# Patient Record
Sex: Female | Born: 1990 | Race: White | Hispanic: No | Marital: Married | Smoking: Never smoker
Health system: Southern US, Community
[De-identification: ages and names within clinical notes are randomized; demographics above are authoritative.]

## PROBLEM LIST (undated history)

## (undated) ENCOUNTER — Inpatient Hospital Stay: Payer: Self-pay

## (undated) DIAGNOSIS — L309 Dermatitis, unspecified: Secondary | ICD-10-CM

## (undated) DIAGNOSIS — N83209 Unspecified ovarian cyst, unspecified side: Secondary | ICD-10-CM

## (undated) DIAGNOSIS — Z973 Presence of spectacles and contact lenses: Secondary | ICD-10-CM

## (undated) DIAGNOSIS — G43909 Migraine, unspecified, not intractable, without status migrainosus: Secondary | ICD-10-CM

## (undated) HISTORY — DX: Unspecified ovarian cyst, unspecified side: N83.209

## (undated) HISTORY — DX: Migraine, unspecified, not intractable, without status migrainosus: G43.909

---
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
## 2010-03-08 ENCOUNTER — Emergency Department: Payer: Self-pay | Admitting: Emergency Medicine

## 2013-12-19 DIAGNOSIS — Z6791 Unspecified blood type, Rh negative: Secondary | ICD-10-CM | POA: Insufficient documentation

## 2013-12-19 DIAGNOSIS — O26899 Other specified pregnancy related conditions, unspecified trimester: Secondary | ICD-10-CM | POA: Insufficient documentation

## 2014-01-22 DIAGNOSIS — N83209 Unspecified ovarian cyst, unspecified side: Secondary | ICD-10-CM

## 2014-01-22 HISTORY — PX: DILATION AND CURETTAGE OF UTERUS: SHX78

## 2014-01-22 HISTORY — DX: Unspecified ovarian cyst, unspecified side: N83.209

## 2014-02-14 ENCOUNTER — Ambulatory Visit: Payer: Self-pay | Admitting: Obstetrics and Gynecology

## 2014-02-14 LAB — HEMOGLOBIN: HGB: 14.1 g/dL (ref 12.0–16.0)

## 2014-02-15 ENCOUNTER — Ambulatory Visit: Payer: Self-pay | Admitting: Obstetrics and Gynecology

## 2014-07-15 NOTE — Op Note (Signed)
PATIENT NAME:  Jamie [name], Jamie [name] MR#:  [id number] DATE OF BIRTH:  July 20, 1990  DATE OF PROCEDURE:  02/15/2014  PREOPERATIVE DIAGNOSIS: Missed abortion.   POSTOPERATIVE DIAGNOSIS: Missed abortion.   PROCEDURE: Suction dilation and curettage.   SURGEON: Laverta Baltimore, MD   ANESTHESIA: General endotracheal anesthesia.   INDICATION: A 24 year old gravida 1, para 0, patient at 49 weeks estimated gestational age. Ultrasound the day prior to the procedure showed crown-rump length consistent with 11 + 4 weeks and no fetal heart motion.   DESCRIPTION OF PROCEDURE: After adequate general endotracheal anesthesia, the patient was placed in the dorsal supine position. Legs were placed in the candycane stirrups. The patient was prepped and draped in the normal sterile fashion. A straight catheterization of the bladder yielded 150 mL of clear urine. A weighted speculum was placed in the posterior vaginal vault and the anterior cervix was grasped with a single-tooth tenaculum. Cervix was dilated to #18 Hanks dilator without difficulty, and a #8 flexible suction curette was placed into the endometrial cavity, a large amount of tissue was removed. The endometrial polyp forceps were used to tease out additional placental tissue and membranes. Sharp curettage was performed with good uterine cry on repeat. A repeat suction curettage performed with no additional tissue. Good hemostasis was noted. The patient did receive 0.2 mg of Methergine IM. The single-tooth tenaculum was removed from the anterior cervix, and silver nitrate was applied to the tenacula sites. The uterus palpated to 13 weeks prior to the procedure, and 8 weeks post procedure.   The patient tolerated the procedure well, and was taken to the recovery room in good condition.   The patient will receive a RhoGAM shot in the PACU given her Rh status is O negative.     ____________________________ Boykin Nearing, MD tjs:MT D: 02/15/2014  10:50:58 ET T: 02/15/2014 12:12:59 ET JOB#: [id number]  cc: Boykin Nearing, MD, <Dictator> Boykin Nearing MD ELECTRONICALLY SIGNED 02/18/2014 9:49

## 2014-07-17 LAB — SURGICAL PATHOLOGY

## 2014-09-29 ENCOUNTER — Encounter: Payer: Self-pay | Admitting: Internal Medicine

## 2014-12-08 ENCOUNTER — Encounter: Payer: Self-pay | Admitting: Obstetrics and Gynecology

## 2014-12-11 ENCOUNTER — Encounter: Payer: Self-pay | Admitting: Obstetrics and Gynecology

## 2014-12-15 ENCOUNTER — Other Ambulatory Visit: Payer: BLUE CROSS/BLUE SHIELD

## 2014-12-15 ENCOUNTER — Other Ambulatory Visit: Payer: Self-pay | Admitting: *Deleted

## 2014-12-15 ENCOUNTER — Encounter: Payer: Self-pay | Admitting: Obstetrics and Gynecology

## 2014-12-15 ENCOUNTER — Ambulatory Visit (INDEPENDENT_AMBULATORY_CARE_PROVIDER_SITE_OTHER): Payer: BLUE CROSS/BLUE SHIELD | Admitting: Obstetrics and Gynecology

## 2014-12-15 VITALS — BP 123/80 | HR 84 | Wt 121.9 lb

## 2014-12-15 DIAGNOSIS — N943 Premenstrual tension syndrome: Secondary | ICD-10-CM | POA: Diagnosis not present

## 2014-12-15 DIAGNOSIS — N921 Excessive and frequent menstruation with irregular cycle: Secondary | ICD-10-CM | POA: Diagnosis not present

## 2014-12-15 DIAGNOSIS — N839 Noninflammatory disorder of ovary, fallopian tube and broad ligament, unspecified: Secondary | ICD-10-CM

## 2014-12-15 DIAGNOSIS — G43839 Menstrual migraine, intractable, without status migrainosus: Secondary | ICD-10-CM | POA: Diagnosis not present

## 2014-12-15 DIAGNOSIS — R102 Pelvic and perineal pain: Secondary | ICD-10-CM

## 2014-12-15 DIAGNOSIS — N838 Other noninflammatory disorders of ovary, fallopian tube and broad ligament: Secondary | ICD-10-CM

## 2014-12-15 DIAGNOSIS — N920 Excessive and frequent menstruation with regular cycle: Secondary | ICD-10-CM

## 2014-12-15 DIAGNOSIS — N946 Dysmenorrhea, unspecified: Secondary | ICD-10-CM

## 2014-12-15 MED ORDER — NORGESTIMATE-ETH ESTRADIOL 0.25-35 MG-MCG PO TABS: 1.0000 | ORAL_TABLET | Freq: Every day | ORAL | Status: DC

## 2014-12-15 MED ORDER — IBUPROFEN 800 MG PO TABS: 800.0000 mg | ORAL_TABLET | Freq: Three times a day (TID) | ORAL | Status: DC | PRN

## 2014-12-15 NOTE — Progress Notes (Signed)
Subjective:     Patient ID: Maxie Better, female   DOB: 1991/03/10, 24 y.o.   MRN: [id number]  HPI Had D&C in Nov 2015 for 12 week missed abortion, was started on OCPs afterwards, and has had persistent BTB and heavy menses with severe cramping and pain with sex since; States they changed her pills a few times, and has been taking Loestrin since April with no improvement.  Review of Systems BTB Heavy menses, Pain with sex Back pain Severe cramping    Objective:   Physical Exam A&O x4 Well groomed female in no distress abdomen soft and non-tender Pelvic exam: normal external genitalia, vulva, vagina, cervix, uterus and adnexa, UTERUS: uterus is normal size, shape, consistency and nontender, mobile, shifted to the right , ADNEXA: fullness noted in left adnexa.   Indications:Left adnexal pain Findings:  The uterus measures 6.1 x 3.0 x 3.8 cm. Echo texture is homogenous without evidence of focal masses. The Endometrium measures 1.8 mm.  Right Ovary measures 2.3 x 1.2 x 1.4 cm. It is normal in appearance. Left Ovarian tissue is not visible. Survey of the adnexa demonstrates a large complex hypoechoic mass in the left adnexa with internal mobile debris and calcifications. This area measures 5.1 x 4.1 x 4.5 cm. There is no free fluid in the cul de sac.  Impression: 1. Large complex hypoechoic area left adnexa with internal debris and calcifications.  Assessment:     Left ovarian complex mass  Pelvic pain Menorrhagia on OCPs    Plan:     Change OCPs to higher dose and take continuously. Add Motrin 800mg  q8h prn- rx sent RTC 5-6 weeks for f/u u/s and see me

## 2014-12-15 NOTE — Patient Instructions (Signed)
Thank you for enrolling in [facility]. Please follow the instructions below to securely access your online medical record. MyChart allows you to send messages to your doctor, view your test results, renew your prescriptions, schedule appointments, and more.  How Do I Sign Up? 1. In your Internet browser, go to [email or web address]. 2. Click on the New  User? link in the Sign In box.  3. Enter your MyChart Access Code exactly as it appears below. You will not need to use this code after you have completed the sign-up process. If you do not sign up before the expiration date, you must request a new code. MyChart Access Code: [email or web address] Expires: 02/13/2015 12:27 PM  4. Enter the last four digits of your Social Security Number (xxxx) and Date of Birth (mm/dd/yyyy) as indicated and click Next. You will be taken to the next sign-up page. 5. Create a MyChart ID. This will be your MyChart login ID and cannot be changed, so think of one that is secure and easy to remember. 6. Create a MyChart password. You can change your password at any time. 7. Enter your Password Reset Question and Answer and click Next. This can be used at a later time if you forget your password.  8. Select your communication preference, and if applicable enter your e-mail address. You will receive e-mail notification when new information is available in MyChart by choosing to receive e-mail notifications and filling in your e-mail. 9. Click Sign In. You can now view your medical record.   Additional Information If you have questions, you can email [email or web address] or call [phone] to talk to our [facility] staff. Remember, MyChart is NOT to be used for urgent needs. For medical emergencies, dial 911. Ovarian Cyst An ovarian cyst is a fluid-filled sac that forms on an ovary. The ovaries are small organs that produce eggs in women. Various types of cysts can form on the ovaries. Most are not  cancerous. Many do not cause problems, and they often go away on their own. Some may cause symptoms and require treatment. Common types of ovarian cysts include:  Functional cysts--These cysts may occur every month during the menstrual cycle. This is normal. The cysts usually go away with the next menstrual cycle if the woman does not get pregnant. Usually, there are no symptoms with a functional cyst.  Endometrioma cysts--These cysts form from the tissue that lines the uterus. They are also called "chocolate cysts" because they become filled with blood that turns brown. This type of cyst can cause pain in the lower abdomen during intercourse and with your menstrual period.  Cystadenoma cysts--This type develops from the cells on the outside of the ovary. These cysts can get very big and cause lower abdomen pain and pain with intercourse. This type of cyst can twist on itself, cut off its blood supply, and cause severe pain. It can also easily rupture and cause a lot of pain.  Dermoid cysts--This type of cyst is sometimes found in both ovaries. These cysts may contain different kinds of body tissue, such as skin, teeth, hair, or cartilage. They usually do not cause symptoms unless they get very big.  Theca lutein cysts--These cysts occur when too much of a certain hormone (human chorionic gonadotropin) is produced and overstimulates the ovaries to produce an egg. This is most common after procedures used to assist with the conception of a baby (in vitro fertilization). CAUSES   Fertility drugs can cause a condition  in which multiple large cysts are formed on the ovaries. This is called ovarian hyperstimulation syndrome.  A condition called polycystic ovary syndrome can cause hormonal imbalances that can lead to nonfunctional ovarian cysts. SIGNS AND SYMPTOMS  Many ovarian cysts do not cause symptoms. If symptoms are present, they may include:  Pelvic pain or pressure.  Pain in the lower  abdomen.  Pain during sexual intercourse.  Increasing girth (swelling) of the abdomen.  Abnormal menstrual periods.  Increasing pain with menstrual periods.  Stopping having menstrual periods without being pregnant. DIAGNOSIS  These cysts are commonly found during a routine or annual pelvic exam. Tests may be ordered to find out more about the cyst. These tests may include:  Ultrasound.  X-ray of the pelvis.  CT scan.  MRI.  Blood tests. TREATMENT  Many ovarian cysts go away on their own without treatment. Your health care provider may want to check your cyst regularly for 2-3 months to see if it changes. For women in menopause, it is particularly important to monitor a cyst closely because of the higher rate of ovarian cancer in menopausal women. When treatment is needed, it may include any of the following:  A procedure to drain the cyst (aspiration). This may be done using a long needle and ultrasound. It can also be done through a laparoscopic procedure. This involves using a thin, lighted tube with a tiny camera on the end (laparoscope) inserted through a small incision.  Surgery to remove the whole cyst. This may be done using laparoscopic surgery or an open surgery involving a larger incision in the lower abdomen.  Hormone treatment or birth control pills. These methods are sometimes used to help dissolve a cyst. HOME CARE INSTRUCTIONS   Only take over-the-counter or prescription medicines as directed by your health care provider.  Follow up with your health care provider as directed.  Get regular pelvic exams and Pap tests. SEEK MEDICAL CARE IF:   Your periods are late, irregular, or painful, or they stop.  Your pelvic pain or abdominal pain does not go away.  Your abdomen becomes larger or swollen.  You have pressure on your bladder or trouble emptying your bladder completely.  You have pain during sexual intercourse.  You have feelings of fullness,  pressure, or discomfort in your stomach.  You lose weight for no apparent reason.  You feel generally ill.  You become constipated.  You lose your appetite.  You develop acne.  You have an increase in body and facial hair.  You are gaining weight, without changing your exercise and eating habits.  You think you are pregnant. SEEK IMMEDIATE MEDICAL CARE IF:   You have increasing abdominal pain.  You feel sick to your stomach (nauseous), and you throw up (vomit).  You develop a fever that comes on suddenly.  You have abdominal pain during a bowel movement.  Your menstrual periods become heavier than usual. MAKE SURE YOU:  Understand these instructions.  Will watch your condition.  Will get help right away if you are not doing well or get worse. Document Released: 03/10/2005 Document Revised: 03/15/2013 Document Reviewed: 11/15/2012 [facility] Patient Information 2015 [place], [place]. This information is not intended to replace advice given to you by your health care provider. Make sure you discuss any questions you have with your health care provider.

## 2014-12-16 LAB — COMPREHENSIVE METABOLIC PANEL
A/G RATIO: 1.8 (ref 1.1–2.5)
ALBUMIN: 4.2 g/dL (ref 3.5–5.5)
ALT: 12 IU/L (ref 0–32)
AST: 14 IU/L (ref 0–40)
Alkaline Phosphatase: 62 IU/L (ref 39–117)
BUN/Creatinine Ratio: 9 (ref 8–20)
BUN: 8 mg/dL (ref 6–20)
Bilirubin Total: 0.4 mg/dL (ref 0.0–1.2)
CO2: 20 mmol/L (ref 18–29)
Calcium: 9 mg/dL (ref 8.7–10.2)
Chloride: 103 mmol/L (ref 97–108)
Creatinine, Ser: 0.85 mg/dL (ref 0.57–1.00)
GFR, EST AFRICAN AMERICAN: 109 mL/min/{1.73_m2} (ref >59)
GFR, EST NON AFRICAN AMERICAN: 95 mL/min/{1.73_m2} (ref >59)
Globulin, Total: 2.4 g/dL (ref 1.5–4.5)
Glucose: 82 mg/dL (ref 65–99)
POTASSIUM: 4.2 mmol/L (ref 3.5–5.2)
Sodium: 140 mmol/L (ref 134–144)
TOTAL PROTEIN: 6.6 g/dL (ref 6.0–8.5)

## 2014-12-16 LAB — CBC
HEMATOCRIT: 46.3 % (ref 34.0–46.6)
HEMOGLOBIN: 15.4 g/dL (ref 11.1–15.9)
MCH: 29.4 pg (ref 26.6–33.0)
MCHC: 33.3 g/dL (ref 31.5–35.7)
MCV: 88 fL (ref 79–97)
Platelets: 423 10*3/uL — ABNORMAL HIGH (ref 150–379)
RBC: 5.24 x10E6/uL (ref 3.77–5.28)
RDW: 12.3 % (ref 12.3–15.4)
WBC: 10.4 10*3/uL (ref 3.4–10.8)

## 2014-12-16 LAB — THYROID PANEL WITH TSH
FREE THYROXINE INDEX: 2.7 (ref 1.2–4.9)
T3 Uptake Ratio: 22 % — ABNORMAL LOW (ref 24–39)
T4, Total: 12.4 ug/dL — ABNORMAL HIGH (ref 4.5–12.0)
TSH: 1.42 u[IU]/mL (ref 0.450–4.500)

## 2014-12-16 LAB — VITAMIN B12: Vitamin B-12: 262 pg/mL (ref 211–946)

## 2014-12-16 LAB — FERRITIN: Ferritin: 32 ng/mL (ref 15–150)

## 2014-12-19 ENCOUNTER — Telehealth: Payer: Self-pay | Admitting: Obstetrics and Gynecology

## 2014-12-19 NOTE — Telephone Encounter (Signed)
Yes, please let her know all labs looked fine

## 2014-12-19 NOTE — Telephone Encounter (Signed)
Results

## 2014-12-19 NOTE — Telephone Encounter (Signed)
PT CALLED AND WAS TOLD TO CALL IN TODAY PER [facility] FOR HER LAB RESULTS. SHE SAID IF YOU DID NOT GET HER ON HER CELL THAT YOU CAN CALL HER AT WORK.

## 2014-12-19 NOTE — Telephone Encounter (Signed)
Left detailed message for pt all labs normal

## 2014-12-22 ENCOUNTER — Ambulatory Visit: Payer: Self-pay | Admitting: Internal Medicine

## 2015-01-05 ENCOUNTER — Telehealth: Payer: Self-pay | Admitting: Obstetrics and Gynecology

## 2015-01-05 NOTE — Telephone Encounter (Signed)
Left message as asked and informed not to take placebo, and I encouraged pt to continue with Motrin as prescribed. To contact office if pain is unbearable, that pain is not uncommon with ovarian cyst.

## 2015-01-05 NOTE — Telephone Encounter (Signed)
Absolutely don't want her to take placebo, not only due to migraines, but also if she takes it continuously it will make the cyst go away faster.

## 2015-01-05 NOTE — Telephone Encounter (Signed)
She was put on new BC and normally she does not take the placebo because it causes her migraine, and she wanted to know if she can start a new pack today or if she needs to take the placebo, pt said out can call her back and leave a message if she does not answer, and she is in a lot of pain due to the cyst on her ovary, and she has been taking the 800 mg of ibrophen and it helps but she is still having pain and wanted to know if that is normal.

## 2015-01-19 ENCOUNTER — Encounter: Payer: Self-pay | Admitting: Obstetrics and Gynecology

## 2015-01-19 ENCOUNTER — Ambulatory Visit (INDEPENDENT_AMBULATORY_CARE_PROVIDER_SITE_OTHER): Payer: BLUE CROSS/BLUE SHIELD | Admitting: Obstetrics and Gynecology

## 2015-01-19 ENCOUNTER — Ambulatory Visit: Payer: BLUE CROSS/BLUE SHIELD

## 2015-01-19 VITALS — BP 121/72 | HR 90 | Wt 121.7 lb

## 2015-01-19 DIAGNOSIS — N838 Other noninflammatory disorders of ovary, fallopian tube and broad ligament: Secondary | ICD-10-CM | POA: Diagnosis not present

## 2015-01-19 DIAGNOSIS — N946 Dysmenorrhea, unspecified: Secondary | ICD-10-CM | POA: Diagnosis not present

## 2015-01-19 DIAGNOSIS — N943 Premenstrual tension syndrome: Secondary | ICD-10-CM | POA: Diagnosis not present

## 2015-01-19 DIAGNOSIS — N921 Excessive and frequent menstruation with irregular cycle: Secondary | ICD-10-CM | POA: Diagnosis not present

## 2015-01-19 DIAGNOSIS — G43839 Menstrual migraine, intractable, without status migrainosus: Secondary | ICD-10-CM | POA: Diagnosis not present

## 2015-01-19 DIAGNOSIS — R102 Pelvic and perineal pain: Secondary | ICD-10-CM | POA: Diagnosis not present

## 2015-01-19 DIAGNOSIS — N920 Excessive and frequent menstruation with regular cycle: Secondary | ICD-10-CM

## 2015-01-19 NOTE — Patient Instructions (Signed)
Diagnostic Laparoscopy A diagnostic laparoscopy is a procedure to diagnose diseases in the abdomen. During the procedure, a thin, lighted, pencil-sized instrument called a laparoscope is inserted into the abdomen through an incision. The laparoscope allows your health care provider to look at the organs inside your body. LET [facility] CARE PROVIDER KNOW ABOUT:  Any allergies you have.  All medicines you are taking, including vitamins, herbs, eye drops, creams, and over-the-counter medicines.  Previous problems you or members of your family have had with the use of anesthetics.  Any blood disorders you have.  Previous surgeries you have had.  Medical conditions you have. RISKS AND COMPLICATIONS  Generally, this is a safe procedure. However, problems can occur, which may include:  Infection.  Bleeding.  Damage to other organs.  Allergic reaction to the anesthetics used during the procedure. BEFORE THE PROCEDURE  Do not eat or drink anything after midnight on the night before the procedure or as directed by your health care provider.  Ask your health care provider about:  Changing or stopping your regular medicines.  Taking medicines such as aspirin and ibuprofen. These medicines can thin your blood. Do not take these medicines before your procedure if your health care provider instructs you not to.  Plan to have someone take you home after the procedure. PROCEDURE  You may be given a medicine to help you relax (sedative).  You will be given a medicine to make you sleep (general anesthetic).  Your abdomen will be inflated with a gas. This will make your organs easier to see.  Small incisions will be made in your abdomen.  A laparoscope and other small instruments will be inserted into the abdomen through the incisions.  A tissue sample may be removed from an organ in the abdomen for examination.  The instruments will be removed from the abdomen.  The gas will be  released.  The incisions will be closed with stitches (sutures). AFTER THE PROCEDURE  Your blood pressure, heart rate, breathing rate, and blood oxygen level will be monitored often until the medicines you were given have worn off.   This information is not intended to replace advice given to you by your health care provider. Make sure you discuss any questions you have with your health care provider.   Document Released: 06/16/2000 Document Revised: 11/29/2014 Document Reviewed: 10/21/2013 Elsevier Interactive Patient Education [employer].

## 2015-01-19 NOTE — Progress Notes (Signed)
Patient ID: Jamie [name], female   DOB: 1991-02-14, 24 y.o.   MRN: [id number]    Indications:  F/U left adnexal mass, menorrhagia and dysmenorrhea Findings:  The uterus measures 7.4 x 3.2 x 3.9 cm Echo texture is homogeous without evidence of focal masses. The Endometrium measures 3.1 mm.  Right Ovary measures 2.2 x 1.7 x 1.4 cm. It is normal in appearance. Left Ovary measures 2.8 x 1.3 x 1.2 cm. It is normal appearance. Survey of the adnexa demonstrates a left adnexal mass located between the uterus and left ovary that measures 4.9 x 4.6 x 5.0 cm which is essentially unchanged from prior ultrasound. Septations and a "ground glass" appearance is noted suggestive of a possible endometrioma. There is no free fluid in the cul de sac.  Impression: 1. Uterus and ovaries appear wnl. 2. Large left adnexal mass is again seen, appearing unchanged in size from prior ultrasound.   Recommendations: 1.Clinical correlation with the patient's History and Physical Exam.   Elliott,Teresa, Rad Tech  Scan reviewed and agree with findings. Reviewed with patient at today's visit.  Melody Trudee Kuster, CNM

## 2015-01-24 ENCOUNTER — Ambulatory Visit (INDEPENDENT_AMBULATORY_CARE_PROVIDER_SITE_OTHER): Payer: BLUE CROSS/BLUE SHIELD | Admitting: Obstetrics and Gynecology

## 2015-01-24 ENCOUNTER — Encounter: Payer: Self-pay | Admitting: Obstetrics and Gynecology

## 2015-01-24 VITALS — BP 113/72 | HR 73 | Ht 60.0 in | Wt 120.8 lb

## 2015-01-24 DIAGNOSIS — N83202 Unspecified ovarian cyst, left side: Secondary | ICD-10-CM | POA: Diagnosis not present

## 2015-01-24 DIAGNOSIS — N920 Excessive and frequent menstruation with regular cycle: Secondary | ICD-10-CM

## 2015-01-24 DIAGNOSIS — N946 Dysmenorrhea, unspecified: Secondary | ICD-10-CM | POA: Diagnosis not present

## 2015-01-24 DIAGNOSIS — F526 Dyspareunia not due to a substance or known physiological condition: Secondary | ICD-10-CM | POA: Diagnosis not present

## 2015-01-24 DIAGNOSIS — IMO0001 Reserved for inherently not codable concepts without codable children: Secondary | ICD-10-CM

## 2015-01-24 MED ORDER — HYDROCODONE-ACETAMINOPHEN 5-325 MG PO TABS: 1.0000 | ORAL_TABLET | Freq: Four times a day (QID) | ORAL | Status: DC | PRN

## 2015-01-24 NOTE — Patient Instructions (Signed)
Diagnostic Laparoscopy A diagnostic laparoscopy is a procedure to diagnose diseases in the abdomen. During the procedure, a thin, lighted, pencil-sized instrument called a laparoscope is inserted into the abdomen through an incision. The laparoscope allows your health care provider to look at the organs inside your body. LET [facility] CARE PROVIDER KNOW ABOUT:  Any allergies you have.  All medicines you are taking, including vitamins, herbs, eye drops, creams, and over-the-counter medicines.  Previous problems you or members of your family have had with the use of anesthetics.  Any blood disorders you have.  Previous surgeries you have had.  Medical conditions you have. RISKS AND COMPLICATIONS  Generally, this is a safe procedure. However, problems can occur, which may include:  Infection.  Bleeding.  Damage to other organs.  Allergic reaction to the anesthetics used during the procedure. BEFORE THE PROCEDURE  Do not eat or drink anything after midnight on the night before the procedure or as directed by your health care provider.  Ask your health care provider about:  Changing or stopping your regular medicines.  Taking medicines such as aspirin and ibuprofen. These medicines can thin your blood. Do not take these medicines before your procedure if your health care provider instructs you not to.  Plan to have someone take you home after the procedure. PROCEDURE  You may be given a medicine to help you relax (sedative).  You will be given a medicine to make you sleep (general anesthetic).  Your abdomen will be inflated with a gas. This will make your organs easier to see.  Small incisions will be made in your abdomen.  A laparoscope and other small instruments will be inserted into the abdomen through the incisions.  A tissue sample may be removed from an organ in the abdomen for examination.  The instruments will be removed from the abdomen.  The gas will be  released.  The incisions will be closed with stitches (sutures). AFTER THE PROCEDURE  Your blood pressure, heart rate, breathing rate, and blood oxygen level will be monitored often until the medicines you were given have worn off.   This information is not intended to replace advice given to you by your health care provider. Make sure you discuss any questions you have with your health care provider.   Document Released: 06/16/2000 Document Revised: 11/29/2014 Document Reviewed: 10/21/2013 Elsevier Interactive Patient Education [employer].

## 2015-01-25 ENCOUNTER — Telehealth: Payer: Self-pay | Admitting: Obstetrics and Gynecology

## 2015-01-25 NOTE — Telephone Encounter (Signed)
PT IS HAVING SURGERY ON Monday AND HER MANAGER WANTED HER TO GET A NOTE FROM DOCTOR CHERRY STATING THAT SHE IS HAVING A PROCEDURE AND THAT SHE  WILL BE OUT FROM HAVING SURGERY. PT STATED SHE WILL COME BY TOMORROW MORNING AND PICK IT UP.

## 2015-01-25 NOTE — H&P (Signed)
Preoperative History and Physical  Jamie [name] is a 24 y.o. G1P0010 here for surgical management of persistent left ovarian cyst (s/p failed medical management) and suspicion for endometriosis.   No significant preoperative concerns.  Proposed surgery: Laparoscopic left ovarian cystectomy, possible excision/fulguration of endometriosis  Past Medical History  Diagnosis Date  . Ovarian cyst   . Migraine    Past Surgical History  Procedure Laterality Date  . Dilation and curettage of uterus  01/2014   OB History  Gravida Para Term Preterm AB SAB TAB Ectopic Multiple Living  1    1  1        # Outcome Date GA Lbr Len/2nd Weight Sex Delivery Anes PTL Lv  1 TAB 01/2014            Patient denies any other pertinent gynecologic issues.    Family History  Problem Relation Age of Onset  . Hypertension Father     Social History  Substance Use Topics  . Smoking status: Never Smoker   . Smokeless tobacco: Never Used  . Alcohol Use: Yes     Comment: occas    Current Outpatient Prescriptions on File Prior to Visit  Medication Sig Dispense Refill  . butalbital-acetaminophen-caffeine (FIORICET WITH CODEINE) 50-325-40-30 MG per capsule Take 1 capsule by mouth every 4 (four) hours as needed for headache.    . ibuprofen (ADVIL,MOTRIN) 800 MG tablet Take 1 tablet (800 mg total) by mouth every 8 (eight) hours as needed. 60 tablet 1  . norgestimate-ethinyl estradiol (ORTHO-CYCLEN,SPRINTEC,PREVIFEM) 0.25-35 MG-MCG tablet Take 1 tablet by mouth daily. 3 Package 3   No current facility-administered medications on file prior to visit.    Allergies  Allergen Reactions  . Amoxicillin Hives  . Penicillins Hives     Review of Systems:  Constitutional: negative for chills, fatigue, fevers and sweats Eyes: negative for irritation, redness and visual disturbance Ears, nose, mouth, throat, and face: negative for hearing loss, nasal congestion, snoring and tinnitus Respiratory: negative for  asthma, cough, sputum Cardiovascular: negative for chest pain, dyspnea, exertional chest pressure/discomfort, irregular heart beat, palpitations and syncope Gastrointestinal: negative for abdominal pain, change in bowel habits, nausea and vomiting Genitourinary: positive mild deep dyspareunia, painful menses, pelvic pain; for negative for abnormal menstrual periods, genital lesions, and vaginal discharge, dysuria and urinary incontinence Integument/breast: negative for breast lump, breast tenderness and nipple discharge Hematologic/lymphatic: negative for bleeding and easy bruising Musculoskeletal:negative for back pain and muscle weakness Neurological: negative for dizziness, headaches, vertigo and weakness Endocrine: negative for diabetic symptoms including polydipsia, polyuria and skin dryness Allergic/Immunologic: negative for hay fever and urticaria     PHYSICAL EXAM: Blood pressure 113/72, pulse 73, height 5' (1.524 m), weight 120 lb 12.8 oz (54.795 kg). Body mass index is 23.59 kg/(m^2).  CONSTITUTIONAL: Well-developed, well-nourished female in no acute distress.  HENT:  Normocephalic, atraumatic, External right and left ear normal. Oropharynx is clear and moist EYES: Conjunctivae and EOM are normal. Pupils are equal, round, and reactive to light. No scleral icterus.  NECK: Normal range of motion, supple, no masses SKIN: Skin is warm and dry. No rash noted. Not diaphoretic. No erythema. No pallor. [place]: Alert and oriented to person, place, and time. Normal reflexes, muscle tone coordination. No cranial nerve deficit noted. PSYCHIATRIC: Normal mood and affect. Normal behavior. Normal judgment and thought content. CARDIOVASCULAR: Normal heart rate noted, regular rhythm RESPIRATORY: Effort and breath sounds normal, no problems with respiration noted ABDOMEN: Soft, mildly tender in LLQ, nondistended. PELVIC: Deferred  MUSCULOSKELETAL: Normal range of motion. No edema and no  tenderness. 2+ distal pulses.  Labs: No results found for this or any previous visit (from the past 336 hour(s)).  Imaging Studies: US Pelvis Complete  01/19/2015  ULTRASOUND REPORT Location: [facility] Date of Service: 01/19/15 Indications:  F/U left adnexal mass, menorrhagia and dysmenorrhea Findings: The uterus measures 7.4 x 3.2 x 3.9 cm Echo texture is homogeous without evidence of focal masses. The Endometrium measures 3.1 mm. Right Ovary measures 2.2 x 1.7 x 1.4 cm. It is normal in appearance. Left Ovary measures 2.8 x 1.3 x 1.2 cm. It is normal appearance. Survey of the adnexa demonstrates a left adnexal mass located between the uterus and left ovary that measures 4.9 x 4.6 x 5.0 cm which is essentially unchanged from prior ultrasound. Septations and a "ground glass" appearance is noted suggestive of a possible endometrioma. There is no free fluid in the cul de sac. Impression: 1. Uterus and ovaries appear wnl. 2. Large left adnexal mass is again seen, appearing unchanged in size from prior ultrasound. Recommendations: 1.Clinical correlation with the patient's History and Physical Exam. Elliott,Teresa, Rad Tech Scan reviewed and agree with findings. Reviewed with patient at today's visit. Melody Trudee Kuster, CNM    Assessment: 1. Persistent left adnexal cyst s/p failed medical management (suspicious for endometrioma) 2. Endometriosis (based on symptoms and ultrasound finding)  Plan: Patient will undergo surgical management with laparoscopic left ovarian cystectomy and possible excision/fulguration of endometriosis scheduled for 01/29/2015.   The risks of surgery were discussed in detail with the patient including but not limited to: bleeding which may require transfusion or reoperation; infection which may require antibiotics; injury to surrounding organs which may involve bowel, bladder, ureters ; need for additional procedures including laparoscopy or laparotomy; thromboembolic  phenomenon, surgical site problems and other postoperative/anesthesia complications. Likelihood of success in alleviating the patient's condition was discussed.Also discussed possibility of possible unilateral oophorectomy depending on the involvement of the cyst. Routine postoperative instructions will be reviewed with the patient and her family in detail after surgery.  The patient concurred with the proposed plan, giving informed written consent for the surgery.     Rubie Maid, MD [facility]

## 2015-01-25 NOTE — Telephone Encounter (Signed)
Letter printed and placed up front.

## 2015-01-25 NOTE — Progress Notes (Signed)
GYNECOLOGY PROGRESS NOTE  Subjective:    Patient ID: Jamie [name], female    DOB: Dec 22, 1990, 24 y.o.   MRN: [id number]  HPI  Patient is a 24 y.o. G83P0010 female who presents as a referral from Gennie Alma, CNM for pre-operative evaluation for persistent left ovarian cyst. Patient notes that she has had the cyst for 2 months, and has been tried on 2 separate OCPs with no resolution.    The following portions of the patient's history were reviewed and updated as appropriate: allergies, current medications, past family history, past medical history, past social history, past surgical history and problem list.  Review of Systems A comprehensive review of systems was negative except for: Genitourinary: positive for heavy menses, severe dysmenorrhea, and dyspareunia (since Nov 2015 after D&C for miscarriage)   Objective:   Blood pressure 113/72, pulse 73, height 5' (1.524 m), weight 120 lb 12.8 oz (54.795 kg). General appearance: alert and no distress Abdomen: normal findings: bowel sounds normal and soft and abnormal findings:  mild tenderness in the LLQ Pelvic: external genitalia normal, vagina normal without discharge. Cervix without lesions, mild CMT. Adnexal fullness on left, normal right adnexa. Uterus normal shape, size, mobile.  Extremities: extremities normal, atraumatic, no cyanosis or edema Neurologic: Grossly normal   Imaging 01/19/2015:  US Pelvis Complete  01/19/2015 ULTRASOUND REPORT Location: [facility] Date of Service: 01/19/15 Indications: F/U left adnexal mass, menorrhagia and dysmenorrhea Findings: The uterus measures 7.4 x 3.2 x 3.9 cm Echo texture is homogeous without evidence of focal masses. The Endometrium measures 3.1 mm. Right Ovary measures 2.2 x 1.7 x 1.4 cm. It is normal in appearance. Left Ovary measures 2.8 x 1.3 x 1.2 cm. It is normal appearance. Survey of the adnexa demonstrates a left adnexal mass located between the uterus and left ovary that  measures 4.9 x 4.6 x 5.0 cm which is essentially unchanged from prior ultrasound. Septations and a "ground glass" appearance is noted suggestive of a possible endometrioma. There is no free fluid in the cul de sac.   Impression:  1. Uterus and ovaries appear wnl.  2. Large left adnexal mass is again seen, appearing unchanged in size from prior ultrasound.   Recommendations: 1.Clinical correlation with the patient's History and Physical Exam.    Assessment:  1. Persistent left adnexal cyst s/p failed medical management (suspicious for endometrioma) 2. Endometriosis (based on symptoms and ultrasound finding)  Plan:  Patient desires surgical management with laparoscopic ovarian cystectomy.  Will also perform excision/fulguration of endometriosis if present. Surgery scheduled for 01/29/2015. The risks of surgery were discussed in detail with the patient including but not limited to: bleeding which may require transfusion or reoperation; infection which may require prolonged hospitalization or re-hospitalization and antibiotic therapy; injury to bowel, bladder, ureters and major vessels or other surrounding organs; need for additional procedures including laparotomy; thromboembolic phenomenon, incisional problems and other postoperative or anesthesia complications.  Patient was told that the likelihood that her condition and symptoms will be treated effectively with this surgical management was very high; the postoperative expectations were also discussed in detail. The patient also understands the alternative treatment options which were discussed in full. In addition, discussed possibility of unilateral oophorectomy if cyst cannot be dissectedAll questions were answered.  She was told that she will be contacted by our surgical scheduler regarding the time and date of her surgery; routine preoperative instructions of having nothing to eat or drink after midnight on the day prior to surgery and  also coming  to the hospital 1.5 hours prior to her time of surgery were also emphasized.  Printed patient education handouts about the procedure were given to the patient to review at home.   A total of 15 minutes were spent face-to-face with the patient during this encounter and over half of that time dealt with counseling and coordination of care.   Rubie Maid, MD [facility]

## 2015-01-26 ENCOUNTER — Encounter
Admission: RE | Admit: 2015-01-26 | Discharge: 2015-01-26 | Disposition: A | Discharge status: Home / Self Care | Payer: BLUE CROSS/BLUE SHIELD | Source: Ambulatory Visit | Attending: Obstetrics and Gynecology | Admitting: Obstetrics and Gynecology

## 2015-01-26 DIAGNOSIS — N946 Dysmenorrhea, unspecified: Secondary | ICD-10-CM | POA: Diagnosis present

## 2015-01-26 DIAGNOSIS — D3912 Neoplasm of uncertain behavior of left ovary: Secondary | ICD-10-CM | POA: Diagnosis not present

## 2015-01-26 DIAGNOSIS — N941 Unspecified dyspareunia: Secondary | ICD-10-CM | POA: Diagnosis not present

## 2015-01-26 LAB — CBC
HCT: 42.3 % (ref 35.0–47.0)
HEMOGLOBIN: 14 g/dL (ref 12.0–16.0)
MCH: 28.5 pg (ref 26.0–34.0)
MCHC: 33 g/dL (ref 32.0–36.0)
MCV: 86.2 fL (ref 80.0–100.0)
PLATELETS: 362 10*3/uL (ref 150–440)
RBC: 4.91 MIL/uL (ref 3.80–5.20)
RDW: 12.3 % (ref 11.5–14.5)
WBC: 12.4 10*3/uL — ABNORMAL HIGH (ref 3.6–11.0)

## 2015-01-26 LAB — TYPE AND SCREEN
ABO/RH(D): O NEG
Antibody Screen: NEGATIVE

## 2015-01-26 LAB — ABO/RH: ABO/RH(D): O NEG

## 2015-01-26 NOTE — Patient Instructions (Signed)
  Your procedure is scheduled on: Monday Nov. 7, 2016 at 12:15 PM . Report to Same Day Surgery.  Remember: Instructions that are not followed completely may result in serious medical risk, up to and including death, or upon the discretion of your surgeon and anesthesiologist your surgery may need to be rescheduled.    _x___ 1. Do not eat food or drink liquids after midnight. No gum chewing or hard candies.     _x___ 2. No Alcohol for 24 hours before or after surgery.   ____ 3. Bring all medications with you on the day of surgery if instructed.    __x__ 4. Notify your doctor if there is any change in your medical condition     (cold, fever, infections).     Do not wear jewelry, make-up, hairpins, clips or nail polish.  Do not wear lotions, powders, or perfumes. You may wear deodorant.  Do not shave 48 hours prior to surgery. Men may shave face and neck.  Do not bring valuables to the hospital.    [facility] is not responsible for any belongings or valuables.               Contacts, dentures or bridgework may not be worn into surgery.  Leave your suitcase in the car. After surgery it may be brought to your room.  For patients admitted to the hospital, discharge time is determined by your treatment team.   Patients discharged the day of surgery will not be allowed to drive home.    Please read over the following fact sheets that you were given:   [facility] Preparing for Surgery  ____ Take these medicines the morning of surgery with A SIP OF WATER: NONE    ____ Fleet Enema (as directed)   _X___ Use CHG Soap as directed  ____ Use inhalers on the day of surgery  ____ Stop metformin 2 days prior to surgery    ____ Take 1/2 of usual insulin dose the night before surgery and none on the morning of          surgery.   ____ Stop Coumadin/Plavix/aspirin does not apply.  _x___ Stop Anti-inflammatories SUCH AS IBUPROFEN NOW.  May take tylenol or Hydrocodone for pain.   ____ Stop  supplements until after surgery.    ____ Bring C-Pap to the hospital.

## 2015-01-29 ENCOUNTER — Ambulatory Visit
Admission: RE | Admit: 2015-01-29 | Discharge: 2015-01-29 | Disposition: A | Discharge status: Home / Self Care | Payer: BLUE CROSS/BLUE SHIELD | Source: Ambulatory Visit | Attending: Obstetrics and Gynecology | Admitting: Obstetrics and Gynecology

## 2015-01-29 ENCOUNTER — Encounter
Admission: RE | Disposition: A | Discharge status: Home / Self Care | Payer: Self-pay | Source: Ambulatory Visit | Attending: Obstetrics and Gynecology

## 2015-01-29 ENCOUNTER — Ambulatory Visit: Payer: BLUE CROSS/BLUE SHIELD | Admitting: Certified Registered Nurse Anesthetist

## 2015-01-29 ENCOUNTER — Encounter: Payer: Self-pay | Admitting: *Deleted

## 2015-01-29 DIAGNOSIS — N946 Dysmenorrhea, unspecified: Secondary | ICD-10-CM | POA: Insufficient documentation

## 2015-01-29 DIAGNOSIS — N941 Unspecified dyspareunia: Secondary | ICD-10-CM | POA: Diagnosis not present

## 2015-01-29 DIAGNOSIS — N920 Excessive and frequent menstruation with regular cycle: Secondary | ICD-10-CM | POA: Diagnosis not present

## 2015-01-29 DIAGNOSIS — D3912 Neoplasm of uncertain behavior of left ovary: Secondary | ICD-10-CM | POA: Diagnosis not present

## 2015-01-29 HISTORY — PX: LAPAROSCOPIC OVARIAN CYSTECTOMY: SHX6248

## 2015-01-29 LAB — POCT PREGNANCY, URINE: Preg Test, Ur: NEGATIVE

## 2015-01-29 SURGERY — EXCISION, CYST, OVARY, LAPAROSCOPIC
Anesthesia: General | Site: Abdomen | Laterality: Left | Wound class: Clean Contaminated

## 2015-01-29 MED ORDER — ONDANSETRON HCL 4 MG/2ML IJ SOLN: 4.0000 mg | Freq: Once | INTRAMUSCULAR | Status: DC | PRN

## 2015-01-29 MED ORDER — HYDROCODONE-ACETAMINOPHEN 5-325 MG PO TABS
ORAL_TABLET | ORAL | Status: AC
  Filled 2015-01-29: qty 1

## 2015-01-29 MED ORDER — BUPIVACAINE HCL 0.5 % IJ SOLN
INTRAMUSCULAR | Status: DC | PRN
  Administered 2015-01-29: 20 mL

## 2015-01-29 MED ORDER — ONDANSETRON HCL 4 MG/2ML IJ SOLN
INTRAMUSCULAR | Status: DC | PRN
  Administered 2015-01-29: 4 mg via INTRAVENOUS

## 2015-01-29 MED ORDER — BUPIVACAINE HCL (PF) 0.5 % IJ SOLN
INTRAMUSCULAR | Status: AC
  Filled 2015-01-29: qty 30

## 2015-01-29 MED ORDER — PROPOFOL 10 MG/ML IV BOLUS
INTRAVENOUS | Status: DC | PRN
  Administered 2015-01-29: 50 mg via INTRAVENOUS
  Administered 2015-01-29: 150 mg via INTRAVENOUS

## 2015-01-29 MED ORDER — LACTATED RINGERS IV SOLN: INTRAVENOUS | Status: DC

## 2015-01-29 MED ORDER — SIMETHICONE 80 MG PO CHEW: 80.0000 mg | CHEWABLE_TABLET | Freq: Four times a day (QID) | ORAL | Status: DC | PRN

## 2015-01-29 MED ORDER — ACETAMINOPHEN 10 MG/ML IV SOLN
INTRAVENOUS | Status: AC
  Filled 2015-01-29: qty 100

## 2015-01-29 MED ORDER — FENTANYL CITRATE (PF) 100 MCG/2ML IJ SOLN
INTRAMUSCULAR | Status: DC | PRN
  Administered 2015-01-29 (×4): 50 ug via INTRAVENOUS

## 2015-01-29 MED ORDER — FAMOTIDINE 20 MG PO TABS
ORAL_TABLET | ORAL | Status: AC
  Filled 2015-01-29: qty 1

## 2015-01-29 MED ORDER — DEXAMETHASONE SODIUM PHOSPHATE 4 MG/ML IJ SOLN
INTRAMUSCULAR | Status: DC | PRN
  Administered 2015-01-29: 10 mg via INTRAVENOUS

## 2015-01-29 MED ORDER — HYDROCODONE-ACETAMINOPHEN 5-325 MG PO TABS: 1.0000 | ORAL_TABLET | Freq: Once | ORAL | Status: DC

## 2015-01-29 MED ORDER — FAMOTIDINE 20 MG PO TABS
20.0000 mg | ORAL_TABLET | Freq: Once | ORAL | Status: AC
  Administered 2015-01-29: 20 mg via ORAL

## 2015-01-29 MED ORDER — LACTATED RINGERS IV SOLN
INTRAVENOUS | Status: DC
  Administered 2015-01-29: 13:00:00 via INTRAVENOUS

## 2015-01-29 MED ORDER — ONDANSETRON HCL 4 MG/2ML IJ SOLN
INTRAMUSCULAR | Status: AC
  Administered 2015-01-29: 4 mg
  Filled 2015-01-29: qty 2

## 2015-01-29 MED ORDER — HYDROMORPHONE HCL 1 MG/ML IJ SOLN
0.2500 mg | INTRAMUSCULAR | Status: DC | PRN
  Administered 2015-01-29 (×4): 0.5 mg via INTRAVENOUS

## 2015-01-29 MED ORDER — MIDAZOLAM HCL 2 MG/2ML IJ SOLN
INTRAMUSCULAR | Status: DC | PRN
  Administered 2015-01-29: 2 mg via INTRAVENOUS

## 2015-01-29 MED ORDER — CITRIC ACID-SODIUM CITRATE 334-500 MG/5ML PO SOLN: 30.0000 mL | ORAL | Status: DC

## 2015-01-29 MED ORDER — HYDROMORPHONE HCL 1 MG/ML IJ SOLN
INTRAMUSCULAR | Status: AC
  Filled 2015-01-29: qty 1

## 2015-01-29 MED ORDER — GLYCOPYRROLATE 0.2 MG/ML IJ SOLN
INTRAMUSCULAR | Status: DC | PRN
  Administered 2015-01-29: .4 mg via INTRAVENOUS

## 2015-01-29 MED ORDER — ROCURONIUM BROMIDE 100 MG/10ML IV SOLN
INTRAVENOUS | Status: DC | PRN
  Administered 2015-01-29: 30 mg via INTRAVENOUS

## 2015-01-29 MED ORDER — LIDOCAINE HCL (CARDIAC) 20 MG/ML IV SOLN
INTRAVENOUS | Status: DC | PRN
  Administered 2015-01-29: 60 mg via INTRAVENOUS

## 2015-01-29 MED ORDER — NEOSTIGMINE METHYLSULFATE 10 MG/10ML IV SOLN
INTRAVENOUS | Status: DC | PRN
  Administered 2015-01-29: 3 mg via INTRAVENOUS

## 2015-01-29 SURGICAL SUPPLY — 35 items
BLADE SURG SZ11 CARB STEEL (BLADE) ×2 IMPLANT
CANISTER SUCT 1200ML W/VALVE (MISCELLANEOUS) ×4 IMPLANT
CATH ROBINSON RED A/P 16FR (CATHETERS) ×2 IMPLANT
CHLORAPREP W/TINT 26ML (MISCELLANEOUS) ×2 IMPLANT
CORD MONOPOLAR M/FML 12FT (MISCELLANEOUS) ×2 IMPLANT
DRSG TEGADERM 2-3/8X2-3/4 SM (GAUZE/BANDAGES/DRESSINGS) ×8 IMPLANT
GLOVE BIO SURGEON STRL SZ 6 (GLOVE) ×12 IMPLANT
GLOVE BIOGEL PI IND STRL 6.5 (GLOVE) ×1 IMPLANT
GLOVE BIOGEL PI INDICATOR 6.5 (GLOVE) ×1
GOWN STRL REUS W/ TWL LRG LVL3 (GOWN DISPOSABLE) ×3 IMPLANT
GOWN STRL REUS W/TWL LRG LVL3 (GOWN DISPOSABLE) ×3
IRRIGATION STRYKERFLOW (MISCELLANEOUS) ×1 IMPLANT
IRRIGATOR STRYKERFLOW (MISCELLANEOUS) ×2
IV LACTATED RINGERS 1000ML (IV SOLUTION) ×4 IMPLANT
KIT RM TURNOVER CYSTO AR (KITS) ×2 IMPLANT
LABEL OR SOLS (LABEL) IMPLANT
LIQUID BAND (GAUZE/BANDAGES/DRESSINGS) ×2 IMPLANT
NS IRRIG 1000ML POUR BTL (IV SOLUTION) ×2 IMPLANT
NS IRRIG 500ML POUR BTL (IV SOLUTION) IMPLANT
PACK GYN LAPAROSCOPIC (MISCELLANEOUS) ×2 IMPLANT
PAD OB MATERNITY 4.3X12.25 (PERSONAL CARE ITEMS) ×2 IMPLANT
PAD PREP 24X41 OB/GYN DISP (PERSONAL CARE ITEMS) ×2 IMPLANT
POUCH ENDO CATCH 10MM SPEC (MISCELLANEOUS) ×2 IMPLANT
SCISSORS METZENBAUM CVD 33 (INSTRUMENTS) ×2 IMPLANT
SHEARS HARMONIC ACE PLUS 36CM (ENDOMECHANICALS) ×2 IMPLANT
SLEEVE ENDOPATH XCEL 5M (ENDOMECHANICALS) ×4 IMPLANT
STRIP CLOSURE SKIN 1/4X4 (GAUZE/BANDAGES/DRESSINGS) ×2 IMPLANT
SUT VIC AB 3-0 SH 27 (SUTURE) ×1
SUT VIC AB 3-0 SH 27X BRD (SUTURE) ×1 IMPLANT
SUT VICRYL 0 AB UR-6 (SUTURE) ×2 IMPLANT
SWABSTK COMLB BENZOIN TINCTURE (MISCELLANEOUS) ×2 IMPLANT
TROCAR ENDO BLADELESS 11MM (ENDOMECHANICALS) ×2 IMPLANT
TROCAR XCEL NON-BLD 5MMX100MML (ENDOMECHANICALS) ×2 IMPLANT
TROCAR XCEL UNIV SLVE 11M 100M (ENDOMECHANICALS) ×2 IMPLANT
TUBING INSUFFLATOR HI FLOW (MISCELLANEOUS) ×2 IMPLANT

## 2015-01-29 NOTE — Anesthesia Postprocedure Evaluation (Signed)
  Anesthesia Post-op Note  Patient: Jamie [name]  Procedure(s) Performed: Procedure(s): LAPAROSCOPIC OVARIAN CYSTECTOMY (Left)  Anesthesia type:General  Patient location: PACU  Post pain: Pain level controlled  Post assessment: Post-op Vital signs reviewed, Patient's Cardiovascular Status Stable, Respiratory Function Stable, Patent Airway and No signs of Nausea or vomiting  Post vital signs: Reviewed and stable  Last Vitals:  Filed Vitals:   01/29/15 1635  BP:   Pulse: 106  Temp:   Resp: 16    Level of consciousness: awake, alert  and patient cooperative  Complications: No apparent anesthesia complications

## 2015-01-29 NOTE — H&P (Signed)
UPDATE TO PREVIOUS HISTORY AND PHYSICAL  The patient has been seen and examined.  H&P is up to date, no changes noted.  Jamie [name] is a 23 y.o. G1P0010 here for surgical management of persistent left ovarian cyst (s/p failed medical management) and suspicion for endometriosis. No significant preoperative concerns.  Procedure, risks, benefits, alternatives have previously been explained.  Plan is for laparoscopic left ovarian cystectomy, however patient has also been counseled on possible oophorectomy. Patient can proceed to the OR for scheduled procedure.    Rubie Maid, MD 01/29/2015 2:07 PM

## 2015-01-29 NOTE — Discharge Instructions (Signed)
DO NOT REMOVE BANDAGES FOR 1 WEEK.    General Gynecological Post-Operative Instructions You may expect to feel dizzy, weak, and drowsy for as long as 24 hours after receiving the medicine that made you sleep (anesthetic).  Do not drive a car, ride a bicycle, participate in physical activities, or take public transportation until you are done taking narcotic pain medicines or as directed by your doctor.  Do not drink alcohol or take tranquilizers.  Do not take medicine that has not been prescribed by your doctor.  Do not sign important papers or make important decisions while on narcotic pain medicines.  Have a responsible person with you.  CARE OF INCISION  Keep incision clean and dry. Take showers instead of baths until your doctor gives you permission to take baths.  Avoid heavy lifting (more than 10 pounds/4.5 kilograms), pushing, or pulling.  Avoid activities that may risk injury to your surgical site.  No sexual intercourse or placement of anything in the vagina for 2-3 weeks or as instructed by your doctor. Only take prescription or over-the-counter medicines  for pain, discomfort, or fever as directed by your doctor. Do not take aspirin. It can make you bleed. Take medicines (antibiotics) that kill germs if they are prescribed for you.  Call the office or go to the Emergency Room if:  You feel sick to your stomach (nauseous).  You start to throw up (vomit).  You have trouble eating or drinking.  You have an oral temperature above 101.  You have constipation that is not helped by adjusting diet or increasing fluid intake. Pain medicines are a common cause of constipation.  You have any other concerns. SEEK IMMEDIATE MEDICAL CARE IF:  You have persistent dizziness.  You have difficulty breathing or a congested sounding (croupy) cough.  You have an oral temperature above 102.5, not controlled by medicine.  There is increasing pain or tenderness near or in the surgical site.    AMBULATORY SURGERY  DISCHARGE INSTRUCTIONS   1) The drugs that you were given will stay in your system until tomorrow so for the next 24 hours you should not:  A) Drive an automobile B) Make any legal decisions C) Drink any alcoholic beverage   2) You may resume regular meals tomorrow.  Today it is better to start with liquids and gradually work up to solid foods.  You may eat anything you prefer, but it is better to start with liquids, then soup and crackers, and gradually work up to solid foods.   3) Please notify your doctor immediately if you have any unusual bleeding, trouble breathing, redness and pain at the surgery site, drainage, fever, or pain not relieved by medication.    4) Additional Instructions:   AMBULATORY SURGERY  DISCHARGE INSTRUCTIONS   5) The drugs that you were given will stay in your system until tomorrow so for the next 24 hours you should not:  D) Drive an automobile E) Make any legal decisions F) Drink any alcoholic beverage   6) You may resume regular meals tomorrow.  Today it is better to start with liquids and gradually work up to solid foods.  You may eat anything you prefer, but it is better to start with liquids, then soup and crackers, and gradually work up to solid foods.   7) Please notify your doctor immediately if you have any unusual bleeding, trouble breathing, redness and pain at the surgery site, drainage, fever, or pain not relieved by medication.  8) Additional Instructions:        Please contact your physician with any problems or Same Day Surgery at [phone], Monday through Friday 6 am to 4 pm, or [place] at [facility] number at [phone].     Please contact your physician with any problems or Same Day Surgery at [phone], Monday through Friday 6 am to 4 pm, or [place] at [facility] number at [phone]\

## 2015-01-29 NOTE — Op Note (Addendum)
.Operative Laparoscopy Procedure Note  Indications: The patient is a 24 y.o. female with large left adnexal cyst (suspected endometrioma per ultrasound),s/p failed medical management with OCPs, symptoms suspicious of endometriosis.  Pre-operative Diagnosis: Left adnexal cyst, dysmenorrhea, dyspareunia, heavy menses  Post-operative Diagnosis: Same  Procedure: Laparoscopic left oophorectomy  Surgeon: Rubie Maid, MD  Assistants: Malachi Paradise, MD  Anesthesia: General endotracheal anesthesia  Findings: Exam Under Anesthesia - left adnexal fullness with palpable mass, uterus mobile and 6-8 week size, right adnexa normal. Laparoscopy -  The anterior cul-de-sac and round ligaments appeared normal The uterus appeared normal. The adnexa on left with large ~ 6 cm bilobed cyst on ovary, normal appearing fallopian tube.  The adnexa on the right appeared normal. Cul-de-sac without endometriosis or other lesions.  Abdomen noted to be free of adhesions.   ASA Class: 1  Procedure Details  The patient was seen in the Holding Room. The risks, benefits, complications, treatment options, and expected outcomes were discussed with the patient. The possibilities of reaction to medication, pulmonary aspiration, perforation of viscus, bleeding, recurrent infection, the need for additional procedures, failure to diagnose a condition, and creating a complication requiring transfusion or operation were discussed with the patient. The patient concurred with the proposed plan, giving informed consent. The patient was taken to the Operating Room, identified as Jamie [name] and the procedure verified as Laparoscopic Left Cystectomy (possible oophorectomy), excision/fulguration of endometriosis if present. A Time Out was held and the above information confirmed.  After induction of general anesthesia, the patient was placed in modified dorsal lithotomy position where she was prepped, draped, and catheterized  in the normal, sterile fashion.  A straight catheterization was performed.   A sterile speculum was placed inside the vagina. The cervix was visualized and an Hulka clamp was was placed for uterine manipulation. A 5 mm umbilical incision was then performed and an Optiview 5-mm trocar and sleeve were then advanced without difficulty with the laparoscope under direct visualization into the abdomen.  The abdomen was then insufflated with carbon dioxide gas.  Adequate pneumoperitoneum was obtained.  A survey of the patient's pelvis and abdomen revealed the findings above. A left and supraumbilical 5-mm lower quadrant port were then placed under direct visualization.  An 11-mm right lower quadrant port was placed under direct visualization.  Each incision was injected with 0.5% Sensorcaine, with a total of 17 ml used, prior to insertion of trocars.    Using a grasper, the mass was tented up and was noted to be bilobed with a more simple-appearing lobe and a complex lobe. The simple lobe of the cyst was incised, and there was noted to be mixture of mucinous and serious fluid.  The cyst wall was opened at there was noted to be strands of hair encased within the cyst.  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  The complex portion of the cyst appeared to significantly involve the ovarian tissue, and no clear identifiable plane could be determined to separate the cyst from the ovary.  The decision was then made to perform an oophorectomy.  The cyst was elevated in place to identify the utero-ovarian ligament, and the Harmonic scalpel was placed across this and several bites were taken with good visualization while ligating. The left ovary was then placed in an Endocatch bag and removed through the suprapubic incision. The skin was extended around this incision and the fascia was extended using the knife. The specimen was removed intact in  the Endocatch bag through this site.   The operative site was  surveyed, and it was found to be hemostatic.   No intraoperative injury to other surrounding organs was noted.  The abdomen was desufflated and all instruments were then removed from the patient's abdomen. The fascial incision of the right port site was closed with a 0 Vicryl figure in a running fashion. All skin incisions were closed with 3-0 Vicryl.  The Hulka clamp was removed from the cervix without complications. The patient tolerated the procedure well.  Sponge, lap, and needle counts were correct times two.  The patient was then taken to the recovery room awake, extubated and in stable condition.  The patient will be discharged to home as per PACU criteria.  Routine postoperative instructions given. She will follow up in the clinic in 2 weeks for postoperative evaluation.  Estimated Blood Loss:  Minimal         Drains: straight catheterization of bladder prior to start of procedure, 200 ml clear urine         Total IV Fluids: 750 mL         Specimens: Left ovary with cyst              Complications:  None; patient tolerated the procedure well.         Disposition: PACU - hemodynamically stable.         Condition: stable   SIGNED:  Rubie Maid, MD [facility]

## 2015-01-29 NOTE — Transfer of Care (Signed)
Immediate Anesthesia Transfer of Care Note  Patient: Jamie [name]  Procedure(s) Performed: Procedure(s): LAPAROSCOPIC OVARIAN CYSTECTOMY (Left)  Patient Location: PACU  Anesthesia Type:General  Level of Consciousness: awake, alert , oriented and patient cooperative  Airway & Oxygen Therapy: Patient Spontanous Breathing and Patient connected to face mask oxygen  Post-op Assessment: Report given to RN, Post -op Vital signs reviewed and stable and Patient moving all extremities X 4  Post vital signs: Reviewed and stable  Last Vitals:  Filed Vitals:   01/29/15 1219  BP: 127/73  Pulse: 92  Temp: 37.6 C  Resp: 16    Complications: No apparent anesthesia complications

## 2015-01-29 NOTE — Anesthesia Procedure Notes (Signed)
Procedure Name: Intubation Date/Time: 01/29/2015 2:59 PM Performed by: Silvana Newness Pre-anesthesia Checklist: Patient identified, Emergency Drugs available, Suction available, Patient being monitored and Timeout performed Patient Re-evaluated:Patient Re-evaluated prior to inductionOxygen Delivery Method: Circle system utilized Preoxygenation: Pre-oxygenation with 100% oxygen Intubation Type: IV induction Ventilation: Mask ventilation without difficulty Laryngoscope Size: Mac and 3 Grade View: Grade I Tube type: Oral Tube size: 7.0 mm Number of attempts: 1 Airway Equipment and Method: Rigid stylet Placement Confirmation: ETT inserted through vocal cords under direct vision,  positive ETCO2 and breath sounds checked- equal and bilateral Secured at: 19 cm Tube secured with: Tape Dental Injury: Teeth and Oropharynx as per pre-operative assessment

## 2015-01-29 NOTE — Anesthesia Preprocedure Evaluation (Signed)
Anesthesia Evaluation  Patient identified by MRN, date of birth, ID band Patient awake    Reviewed: Allergy & Precautions, NPO status , Patient's Chart, lab work & pertinent test results  Airway Mallampati: I  TM Distance: >3 FB Neck ROM: Full    Dental  (+) Teeth Intact   Pulmonary    Pulmonary exam normal        Cardiovascular Exercise Tolerance: Good negative cardio ROS Normal cardiovascular exam     Neuro/Psych    GI/Hepatic negative GI ROS,   Endo/Other  Elevated WBC at 12.4 today. Hb normal at 14.  Renal/GU      Musculoskeletal   Abdominal (+)  Abdomen: soft.    Peds  Hematology   Anesthesia Other Findings   Reproductive/Obstetrics                             Anesthesia Physical Anesthesia Plan  ASA: I  Anesthesia Plan: General   Post-op Pain Management:    Induction: Intravenous  Airway Management Planned: Oral ETT  Additional Equipment:   Intra-op Plan:   Post-operative Plan: Extubation in OR  Informed Consent: I have reviewed the patients History and Physical, chart, labs and discussed the procedure including the risks, benefits and alternatives for the proposed anesthesia with the patient or authorized representative who has indicated his/her understanding and acceptance.     Plan Discussed with: CRNA  Anesthesia Plan Comments:         Anesthesia Quick Evaluation

## 2015-01-30 ENCOUNTER — Encounter: Payer: Self-pay | Admitting: Obstetrics and Gynecology

## 2015-01-31 LAB — SURGICAL PATHOLOGY

## 2015-02-01 ENCOUNTER — Telehealth: Payer: Self-pay | Admitting: Obstetrics and Gynecology

## 2015-02-01 ENCOUNTER — Encounter: Payer: Self-pay | Admitting: Obstetrics and Gynecology

## 2015-02-01 NOTE — Telephone Encounter (Signed)
Patient called again requesting a note to be able to work. Thanks

## 2015-02-01 NOTE — Telephone Encounter (Signed)
Pt had surgery Monday but is feeling good and would like to go back to work tomorrow if possible, but by Monday anyway. She needs a note . Can you bring it to front desk?

## 2015-02-02 NOTE — Telephone Encounter (Signed)
Please let patient know that letter is ready to be picked up. Thanks

## 2015-02-14 ENCOUNTER — Encounter: Payer: Self-pay | Admitting: Obstetrics and Gynecology

## 2015-02-14 ENCOUNTER — Ambulatory Visit (INDEPENDENT_AMBULATORY_CARE_PROVIDER_SITE_OTHER): Payer: BLUE CROSS/BLUE SHIELD | Admitting: Obstetrics and Gynecology

## 2015-02-14 VITALS — BP 116/76 | HR 69 | Ht 60.0 in | Wt 121.0 lb

## 2015-02-14 DIAGNOSIS — Z9889 Other specified postprocedural states: Secondary | ICD-10-CM

## 2015-02-14 DIAGNOSIS — Z90721 Acquired absence of ovaries, unilateral: Secondary | ICD-10-CM

## 2015-02-14 NOTE — Progress Notes (Signed)
GYNECOLOGY POST-OPERATIVE CLINIC VISIT  Subjective:     Jamie [name] is a 24 y.o. female who presents to the clinic 2 weeks status post laparoscopic left oophorectomy for adnexal mass. Eating a regular diet without difficulty. Bowel movements are normal. The patient is not having any pain.  The following portions of the patient's history were reviewed and updated as appropriate: allergies, current medications, past family history, past medical history, past social history, past surgical history and problem list.  Review of Systems Pertinent items noted in HPI and remainder of comprehensive ROS otherwise negative.    Objective:    BP 116/76 mmHg  Pulse 69  Ht 5' (1.524 m)  Wt 121 lb (54.885 kg)  BMI 23.63 kg/m2  LMP 06/23/2014 General:  alert and no distress  Abdomen: soft, bowel sounds active, non-tender  Incision:   healing well, no drainage, no erythema, no hernia, no seroma, no swelling, no dehiscence, incision well approximated       Pathology 01/29/2015:  DIAGNOSIS:  A. OVARY, LEFT; OOPHORECTOMY:  - MATURE CYSTIC TERATOMA (DERMOID CYST).   Assessment:    Doing well postoperatively.    Plan:    1. Continue any current medications. 2. Wound care discussed. 3. Activity restrictions: none 4. Anticipated return to work: now. 5. Operative findings again reviewed. Pathology report discussed 6. Follow up: as needed.   Rubie Maid, MD [facility]

## 2015-04-10 ENCOUNTER — Telehealth: Payer: Self-pay | Admitting: Obstetrics and Gynecology

## 2015-04-10 NOTE — Telephone Encounter (Signed)
Please have pt come in for evaluation.

## 2015-04-10 NOTE — Telephone Encounter (Signed)
Update cell phone [phone] if calling after 4 pm

## 2015-04-10 NOTE — Telephone Encounter (Signed)
Having pain again on left side/ ovary and cyst removed on left side/ bleeding since 11/7, not every day, but spotting several days a week, having weird feelings in her stomach. Does pt need to make an appt. Or is this normal after that surgery 11/7

## 2015-04-17 ENCOUNTER — Ambulatory Visit (INDEPENDENT_AMBULATORY_CARE_PROVIDER_SITE_OTHER): Payer: Managed Care, Other (non HMO) | Admitting: Obstetrics and Gynecology

## 2015-04-17 ENCOUNTER — Encounter: Payer: Self-pay | Admitting: Obstetrics and Gynecology

## 2015-04-17 VITALS — BP 134/85 | HR 79 | Ht 60.0 in | Wt 120.5 lb

## 2015-04-17 DIAGNOSIS — R102 Pelvic and perineal pain: Secondary | ICD-10-CM

## 2015-04-17 DIAGNOSIS — N921 Excessive and frequent menstruation with irregular cycle: Secondary | ICD-10-CM

## 2015-04-18 NOTE — Progress Notes (Signed)
    GYNECOLOGY PROGRESS NOTE  Subjective:    Patient ID: Jamie [name], female    DOB: 06/26/1990, 25 y.o.   MRN: [id number]  HPI  Patient is a 25 y.o. G52P0010 female who presents for complaints of persistent vaginal bleeding and new onset bilateral mild intermittent pelvic pain. Patient is wondering if this is related to her surgery (had laparoscopic left oophorectomy for adnexal mass).  Is currently on OCPs, taking continuously.   The following portions of the patient's history were reviewed and updated as appropriate: allergies, current medications, past family history, past medical history, past social history, past surgical history and problem list.  Review of Systems Pertinent items noted in HPI and remainder of comprehensive ROS otherwise negative.   Objective:   Blood pressure 134/85, pulse 79, height 5' (1.524 m), weight 120 lb 8 oz (54.658 kg), last menstrual period 04/17/2015. General appearance: alert and no distress Abdomen: soft, non-tender; bowel sounds normal; no masses,  no organomegaly Pelvic: cervix normal in appearance, external genitalia normal, no adnexal masses or tenderness, no cervical motion tenderness, rectovaginal septum normal, uterus normal size, shape, and consistency and vagina normal without discharge Extremities: extremities normal, atraumatic, no cyanosis or edema Neurologic: Grossly normal   Assessment:   Breakthrough bleeding on OCPs  Pelvic pain  Plan:   Discussion had with patient that she has been on OCPs for several months without a break (since September).  Discussed that with most continuous cycle regimens, a 1 week hiatus is recommended after at least 3 months of consistent use.  Also was switched to a higher dose of estrogen as she was noting breakthrough bleeding with last OCP prior to current (was also taking continuously).  Advised on 1 week hiatus from OCPs, and then resume.  Otherwise, may need to change OCP.  Patient notes that she is  afraid to come off OCPs she gets debilitating headaches whenever she stops the pills, however headaches can be controlled with Fioricet. Patient states she will try to d/c pills for a few days and resume.  Does not desire to switch to a new pill as she also notes that she and partner are strongly considering attempts at conception in the next few months.   Discussed ordering pelvic ultrasound to assess for pain, however patient notes that pain is mild, and would like to hold off on ultrasound for now unless pain worsens. Can use OTC pain relievers as needed.  Advised to return if symptoms persist or worsen.     Rubie Maid, MD [facility]

## 2015-06-10 DIAGNOSIS — Z90721 Acquired absence of ovaries, unilateral: Secondary | ICD-10-CM | POA: Insufficient documentation

## 2016-03-18 ENCOUNTER — Other Ambulatory Visit: Payer: Self-pay | Admitting: Obstetrics and Gynecology

## 2016-03-18 DIAGNOSIS — M549 Dorsalgia, unspecified: Secondary | ICD-10-CM

## 2016-03-18 DIAGNOSIS — R319 Hematuria, unspecified: Secondary | ICD-10-CM

## 2016-03-21 ENCOUNTER — Ambulatory Visit
Admission: RE | Admit: 2016-03-21 | Discharge: 2016-03-21 | Disposition: A | Discharge status: Home / Self Care | Payer: Managed Care, Other (non HMO) | Source: Ambulatory Visit | Attending: Obstetrics and Gynecology | Admitting: Obstetrics and Gynecology

## 2016-03-21 DIAGNOSIS — R319 Hematuria, unspecified: Secondary | ICD-10-CM

## 2016-03-21 DIAGNOSIS — M549 Dorsalgia, unspecified: Secondary | ICD-10-CM | POA: Insufficient documentation

## 2016-04-17 ENCOUNTER — Ambulatory Visit: Payer: Managed Care, Other (non HMO) | Admitting: Urology

## 2016-05-01 ENCOUNTER — Other Ambulatory Visit: Payer: Self-pay | Admitting: *Deleted

## 2016-05-01 DIAGNOSIS — R3129 Other microscopic hematuria: Secondary | ICD-10-CM

## 2016-05-02 ENCOUNTER — Ambulatory Visit (INDEPENDENT_AMBULATORY_CARE_PROVIDER_SITE_OTHER): Payer: Managed Care, Other (non HMO) | Admitting: Urology

## 2016-05-02 ENCOUNTER — Other Ambulatory Visit
Admission: RE | Admit: 2016-05-02 | Discharge: 2016-05-02 | Disposition: A | Discharge status: Home / Self Care | Payer: Managed Care, Other (non HMO) | Source: Ambulatory Visit | Attending: Urology | Admitting: Urology

## 2016-05-02 ENCOUNTER — Encounter: Payer: Self-pay | Admitting: Urology

## 2016-05-02 VITALS — BP 111/62 | Ht 60.0 in | Wt 129.0 lb

## 2016-05-02 DIAGNOSIS — N9489 Other specified conditions associated with female genital organs and menstrual cycle: Secondary | ICD-10-CM | POA: Insufficient documentation

## 2016-05-02 DIAGNOSIS — R3129 Other microscopic hematuria: Secondary | ICD-10-CM | POA: Insufficient documentation

## 2016-05-02 DIAGNOSIS — R102 Pelvic and perineal pain: Secondary | ICD-10-CM | POA: Insufficient documentation

## 2016-05-02 LAB — PREGNANCY, URINE: Preg Test, Ur: NEGATIVE

## 2016-05-02 LAB — URINALYSIS, COMPLETE (UACMP) WITH MICROSCOPIC
BACTERIA UA: NONE SEEN
Bilirubin Urine: NEGATIVE
Glucose, UA: NEGATIVE mg/dL
Hgb urine dipstick: NEGATIVE
Ketones, ur: NEGATIVE mg/dL
Leukocytes, UA: NEGATIVE
Nitrite: NEGATIVE
PROTEIN: NEGATIVE mg/dL
Specific Gravity, Urine: 1.015 (ref 1.005–1.030)
WBC UA: NONE SEEN WBC/hpf (ref 0–5)
pH: 7 (ref 5.0–8.0)

## 2016-05-02 LAB — BUN: BUN: 7 mg/dL (ref 6–20)

## 2016-05-02 LAB — CREATININE, SERUM: CREATININE: 0.64 mg/dL (ref 0.44–1.00)

## 2016-05-02 NOTE — Progress Notes (Signed)
05/02/2016 11:22 AM   Jamie [name] 05-Sep-1990 [id number]  Referring provider: Darliss Cheney, CNM [place],  [id number]  Chief Complaint  Patient presents with  . Hematuria    New Patient    HPI: Patient is a 26 -year-old Caucasian female who presents today as a referral from their PCP, Darliss Cheney, CNM, for microscopic hematuria.    Patient was found to have microscopic hematuria on four occassions over the last two years.   Some of the AMH was associated with positive urine cultures.      She does not have a prior history of recurrent urinary tract infections, nephrolithiasis, trauma to the genitourinary tract or malignancies of the genitourinary tract.   She does not have a family medical history of nephrolithiasis, malignancies of the genitourinary tract or hematuria.   Today, she is  having symptoms of frequent urination, but she attributes this to drinking 70 to 80 oz of water daily. .  Her UA today demonstrates 0-5 RBC's.  She is not experiencing any suprapubic pain, abdominal pain or flank pain.  She denies any recent fevers, chills, nausea or vomiting.   She had a RUS in 2017 which was normal.  I have independently reviewed the films.  She is not a smoker.  She is not exposed to secondhand smoke.  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    PMH: Past Medical History:  Diagnosis Date  . Migraine   . Ovarian cyst   . Ovarian cyst 01/2014   left side    Surgical History: Past Surgical History:  Procedure Laterality Date  . DILATION AND CURETTAGE OF UTERUS  01/2014  . LAPAROSCOPIC OVARIAN CYSTECTOMY Left 01/29/2015   Procedure: LAPAROSCOPIC OVARIAN CYSTECTOMY;  Surgeon: Rubie Maid, MD;  Location: [facility] ORS;  Service: Gynecology;  Laterality: Left;  . WISDOM TOOTH EXTRACTION Bilateral 2009   all 4 teeth    Home Medications:  Allergies as of 05/02/2016      Reactions   Amoxicillin Hives   Penicillins Hives   Adhesive [tape] Rash   Round band aide Paper tape is OK. Round band aide Paper tape is OK.      Medication List       Accurate as of 05/02/16 11:22 AM. Always use your most recent med list.          butalbital-acetaminophen-caffeine 50-325-40-30 MG capsule Commonly known as:  FIORICET WITH CODEINE Take 1 capsule by mouth every 4 (four) hours as needed for headache.   multivitamin-prenatal 27-0.8 MG Tabs tablet Take 1 tablet by mouth daily at 12 noon.       Allergies:  Allergies  Allergen Reactions  . Amoxicillin Hives  . Penicillins Hives  . Adhesive [Tape] Rash    Round band aide Paper tape is OK. Round band aide Paper tape is OK.    Family History: Family History  Problem Relation Age of Onset  . Hypertension Father     Social History:  reports that she has never smoked. She has never used smokeless tobacco. She reports that she drinks alcohol. She reports that she does not use drugs.  ROS: UROLOGY Frequent Urination?: Yes Hard to postpone urination?: No Burning/pain with urination?: No Get up at night to urinate?: No Leakage of urine?: No Urine stream starts and stops?: No Trouble starting stream?: No Do you have to strain to urinate?: No Blood in urine?: Yes Urinary tract infection?: No Sexually transmitted disease?: No Injury  to kidneys or bladder?: No Painful intercourse?: Yes Weak stream?: No Currently pregnant?: No Vaginal bleeding?: No Last menstrual period?: 03/20/16  Gastrointestinal Nausea?: No Vomiting?: No Indigestion/heartburn?: No Diarrhea?: No Constipation?: No  Constitutional Fever: No Night sweats?: No Weight loss?: No Fatigue?: No  Skin Skin rash/lesions?: No Itching?: No  Eyes Blurred vision?: No Double vision?: No  Ears/Nose/Throat Sore throat?: No Sinus problems?: No  Hematologic/Lymphatic Swollen glands?: No Easy bruising?: No  Cardiovascular Leg swelling?: No Chest pain?: No  Respiratory Cough?:  No Shortness of breath?: No  Endocrine Excessive thirst?: Yes  Musculoskeletal Back pain?: Yes Joint pain?: No  Neurological Headaches?: Yes Dizziness?: No  Psychologic Depression?: No Anxiety?: No  Physical Exam: BP 111/62   Ht 5' (1.524 m)   Wt 129 lb (58.5 kg)   LMP 03/20/2016 (Approximate)   BMI 25.19 kg/m   Constitutional: Well nourished. Alert and oriented, No acute distress. HEENT: [place] AT, moist mucus membranes. Trachea midline, no masses. Cardiovascular: No clubbing, cyanosis, or edema. Respiratory: Normal respiratory effort, no increased work of breathing. GI: Abdomen is soft, non tender, non distended, no abdominal masses. Liver and spleen not palpable.  No hernias appreciated.  Stool sample for occult testing is not indicated.   GU: No CVA tenderness.  No bladder fullness or masses.   Skin: No rashes, bruises or suspicious lesions. Lymph: No cervical or inguinal adenopathy. Neurologic: Grossly intact, no focal deficits, moving all 4 extremities. Psychiatric: Normal mood and affect.  Laboratory Data: Lab Results  Component Value Date   WBC 12.4 (H) 01/26/2015   HGB 14.0 01/26/2015   HCT 42.3 01/26/2015   MCV 86.2 01/26/2015   PLT 362 01/26/2015    Lab Results  Component Value Date   CREATININE 0.64 05/02/2016     Lab Results  Component Value Date   TSH 1.420 12/15/2014    Lab Results  Component Value Date   AST 14 12/15/2014   Lab Results  Component Value Date   ALT 12 12/15/2014     Urinalysis 0-5 RBC's.  See EPIC.   Pertinent Imaging: CLINICAL DATA:  Microhematuria.  Right flank pain on off for 1 week.  EXAM: RENAL / URINARY TRACT ULTRASOUND COMPLETE  COMPARISON:  None.  FINDINGS: Right Kidney:  Length: 11.4 cm. Echogenicity within normal limits. No mass or hydronephrosis visualized.  Left Kidney:  Length: 10.9 cm. Echogenicity within normal limits. No mass or hydronephrosis visualized.  Bladder:  Normal.   Normal bilateral ureteral jets visualized.  IMPRESSION: Normal renal ultrasound.   Electronically Signed   By: Lajean Manes M.D.   On: 03/21/2016 14:41  Assessment & Plan:    1. Microscopic hematuria   - I explained to the patient that there are a number of causes that can be associated with blood in the urine, such as stones, UTI's, damage to the urinary tract and/or cancer.  - At this time, I felt that the patient warranted further urologic evaluation.   The AUA guidelines state that a CT urogram is the preferred imaging study to evaluate hematuria.  - I explained to the patient that a contrast material will be injected into a vein and that in rare instances, an allergic reaction can result and may even life threatening   The patient denies any allergies to contrast, iodine and/or seafood and is not taking metformin.  - Her reproductive status is unknown at this time.  We will obtain a serum pregnancy test today.   - UA  - urine  culture  - BUN + Creatinine  - serum pregnancy test  Return for I will call patient with results.  These notes generated with voice recognition software. I apologize for typographical errors.  Zara Council, [facility] [address] [place], [place] [address] [phone]

## 2016-05-03 LAB — URINE CULTURE: Culture: <10000 — AB

## 2016-05-16 ENCOUNTER — Other Ambulatory Visit
Admission: RE | Admit: 2016-05-16 | Discharge: 2016-05-16 | Disposition: A | Discharge status: Home / Self Care | Payer: Managed Care, Other (non HMO) | Source: Ambulatory Visit | Attending: Urology | Admitting: Urology

## 2016-05-16 ENCOUNTER — Other Ambulatory Visit: Payer: Self-pay

## 2016-05-16 ENCOUNTER — Ambulatory Visit
Admission: RE | Admit: 2016-05-16 | Discharge: 2016-05-16 | Disposition: A | Discharge status: Home / Self Care | Payer: Managed Care, Other (non HMO) | Source: Ambulatory Visit | Attending: Urology | Admitting: Urology

## 2016-05-16 DIAGNOSIS — N200 Calculus of kidney: Secondary | ICD-10-CM | POA: Insufficient documentation

## 2016-05-16 DIAGNOSIS — R3129 Other microscopic hematuria: Secondary | ICD-10-CM

## 2016-05-16 LAB — HCG, QUANTITATIVE, PREGNANCY

## 2016-05-16 MED ORDER — IOPAMIDOL (ISOVUE-300) INJECTION 61%
125.0000 mL | Freq: Once | INTRAVENOUS | Status: AC | PRN
  Administered 2016-05-16: 125 mL via INTRAVENOUS

## 2016-05-19 ENCOUNTER — Telehealth: Payer: Self-pay

## 2016-05-19 ENCOUNTER — Telehealth: Payer: Self-pay | Admitting: Urology

## 2016-05-19 NOTE — Telephone Encounter (Signed)
Spoke with pt in reference to CT results and needing a cysto to evaluate for hematuria. Pt voiced understanding. Pt was transferred to the front to make f/u appt.

## 2016-05-19 NOTE — Telephone Encounter (Signed)
Enter in error

## 2016-05-19 NOTE — Telephone Encounter (Signed)
LMOM

## 2016-05-19 NOTE — Telephone Encounter (Signed)
Pt had CT done last Friday and doesn't have a follow up.  She is very anxious and wants to know if you will call her with CT results.  I told her it may not be til the end of the day.

## 2016-05-19 NOTE — Telephone Encounter (Signed)
-----   Message from Nori Riis, PA-C sent at 05/17/2016  6:40 PM EST ----- Please notify the patient that her CT scan has found a 2 mm kidney stone.  These stone should no be causing flank pain and may or may not be causing the blood in the urine.  She may choose to under a cystoscopy at this time to evaluate the inside of her bladder to see if there is an area in the bladder that is causing her to have blood in her urine.

## 2016-05-20 NOTE — Telephone Encounter (Signed)
-----   Message from Nori Riis, PA-C sent at 05/17/2016  6:40 PM EST ----- Please notify the patient that her CT scan has found a 2 mm kidney stone.  These stone should no be causing flank pain and may or may not be causing the blood in the urine.  She may choose to under a cystoscopy at this time to evaluate the inside of her bladder to see if there is an area in the bladder that is causing her to have blood in her urine.

## 2016-05-20 NOTE — Telephone Encounter (Signed)
Patient called back to say she already had this message. Patient ok with the plan.

## 2016-05-20 NOTE — Telephone Encounter (Signed)
LMOM for patient to call office back. 

## 2016-06-03 ENCOUNTER — Ambulatory Visit: Payer: Managed Care, Other (non HMO) | Admitting: Urology

## 2016-06-03 ENCOUNTER — Encounter: Payer: Self-pay | Admitting: Urology

## 2016-06-03 VITALS — BP 111/77 | HR 87 | Ht 60.0 in | Wt 127.9 lb

## 2016-06-03 DIAGNOSIS — R3129 Other microscopic hematuria: Secondary | ICD-10-CM | POA: Diagnosis not present

## 2016-06-03 LAB — URINALYSIS, COMPLETE
Bilirubin, UA: NEGATIVE
Glucose, UA: NEGATIVE
KETONES UA: NEGATIVE
Leukocytes, UA: NEGATIVE
Nitrite, UA: NEGATIVE
PROTEIN UA: NEGATIVE
RBC, UA: NEGATIVE
SPEC GRAV UA: 1.01 (ref 1.005–1.030)
Urobilinogen, Ur: 0.2 mg/dL (ref 0.2–1.0)
pH, UA: 6.5 (ref 5.0–7.5)

## 2016-06-03 LAB — MICROSCOPIC EXAMINATION: RBC, UA: NONE SEEN /hpf (ref 0–?)

## 2016-06-03 MED ORDER — LIDOCAINE HCL 2 % EX GEL: 1.0000 "application " | Freq: Once | CUTANEOUS | Status: DC

## 2016-06-03 MED ORDER — CIPROFLOXACIN HCL 500 MG PO TABS: 500.0000 mg | ORAL_TABLET | Freq: Once | ORAL | Status: DC

## 2016-06-03 NOTE — Progress Notes (Signed)
This is a 26 year old female who is seen today for scheduled cystoscopy. She was seen and evaluated for microscopic hematuria by our PA. She has undergone a CT urogram prior to her visit today. This demonstrated a very small nonobstructing right kidney stone, but otherwise no abnormalities.  The patient is otherwise quite healthy and has not had any visible blood. She denies any significant urinary tract symptoms including frequency, urgency, incontinence or dysuria. She does not have a history of recurrent infections. She has had a T and C procedure as well as an oopherectomy.  Current Outpatient Prescriptions on File Prior to Visit  Medication Sig Dispense Refill  . butalbital-acetaminophen-caffeine (FIORICET WITH CODEINE) 50-325-40-30 MG per capsule Take 1 capsule by mouth every 4 (four) hours as needed for headache.    . Prenatal Vit-Fe Fumarate-FA (MULTIVITAMIN-PRENATAL) 27-0.8 MG TABS tablet Take 1 tablet by mouth daily at 12 noon.     No current facility-administered medications on file prior to visit.      Past Medical History:  Diagnosis Date  . Migraine   . Ovarian cyst   . Ovarian cyst 01/2014   left side   Past Surgical History:  Procedure Laterality Date  . DILATION AND CURETTAGE OF UTERUS  01/2014  . LAPAROSCOPIC OVARIAN CYSTECTOMY Left 01/29/2015   Procedure: LAPAROSCOPIC OVARIAN CYSTECTOMY;  Surgeon: Rubie Maid, MD;  Location: [facility] ORS;  Service: Gynecology;  Laterality: Left;  . WISDOM TOOTH EXTRACTION Bilateral 2009   all 4 teeth   Vitals:   06/03/16 0942  BP: 111/77  Pulse: 87   NAD  UA - clear, no RBC.    CT scan: I have independently reviewed the patient's CT scan, she has a nonobstructing right kidney stone which is very small. There are no other significant abnormalities.  Impression/Plan: The patient was scheduled to undergo cystoscopy to complete the hematuria evaluation. We discussed this procedure in significant detail. Given her normal CT scan and  otherwise young and healthy status, I think the probability of finding something on her cystoscopy is low. However, without checking, we can never be 100%. I discussed this with the patient, I told her that she is likely to have hematuria on subsequent UAs, and may have to address this issue in the future, but currently/today the patient is willing to accept the risk (albeit low) that there is something pathologic in her bladder. She will follow-up with [place] on a when necessary basis.

## 2016-12-15 DIAGNOSIS — O0993 Supervision of high risk pregnancy, unspecified, third trimester: Secondary | ICD-10-CM | POA: Insufficient documentation

## 2017-02-26 IMAGING — US US RENAL
1 series · 14 of 25 positions shown · non-contrast
Comparison: None.

CLINICAL DATA: Microhematuria.  Right flank pain on off for 1 week.

EXAM:
RENAL / URINARY TRACT ULTRASOUND COMPLETE

[Series 1: us renal · 0.22mm/px · 14 of 33 slices shown]
[im 1/33]
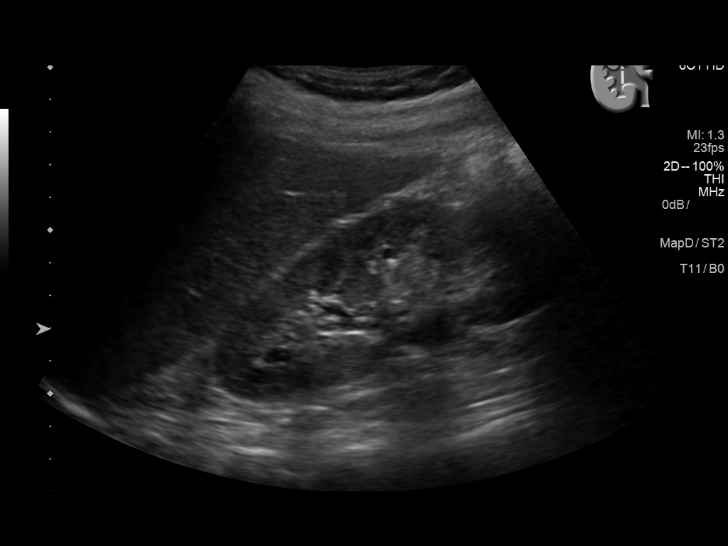
[im 3/33]
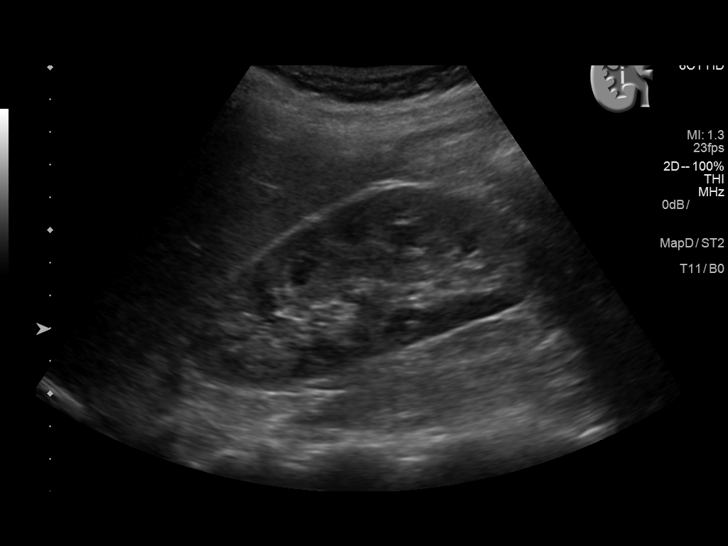
[im 6/33]
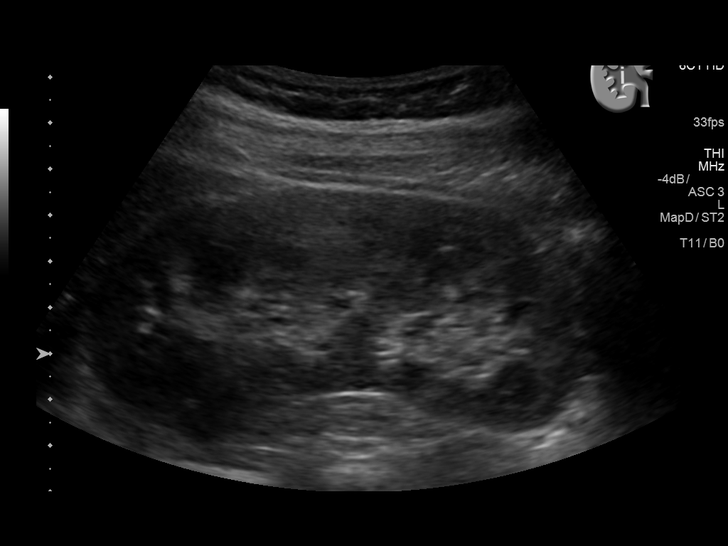
[im 9/33]
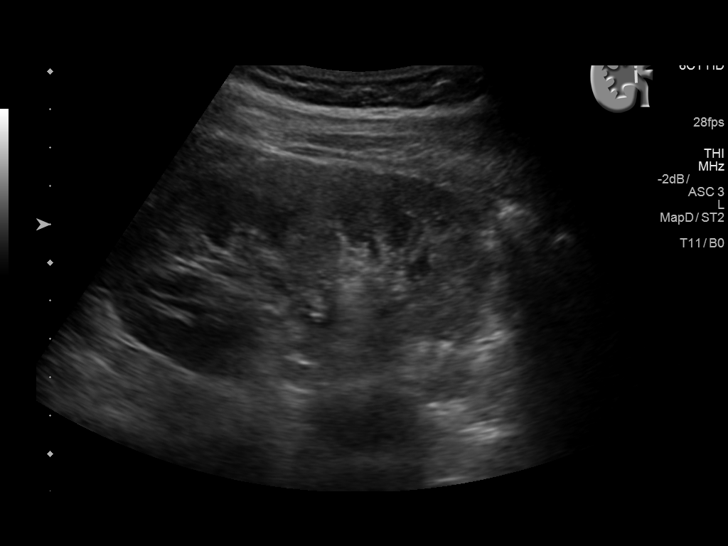
[im 11/33]
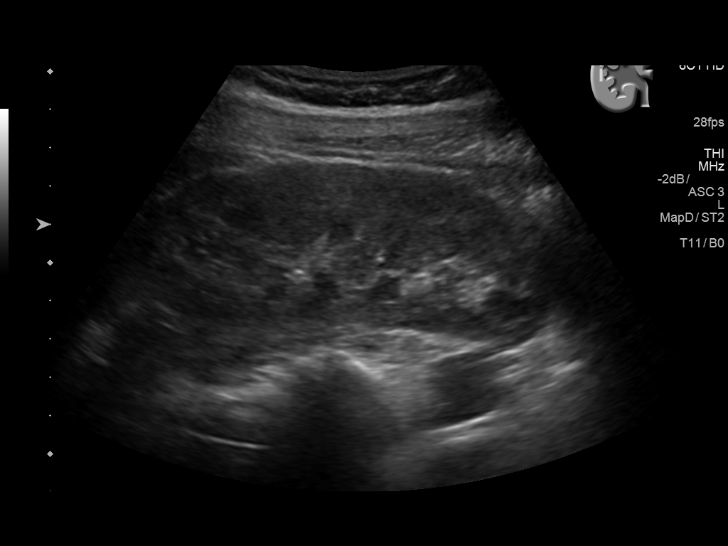
[im 13/33]
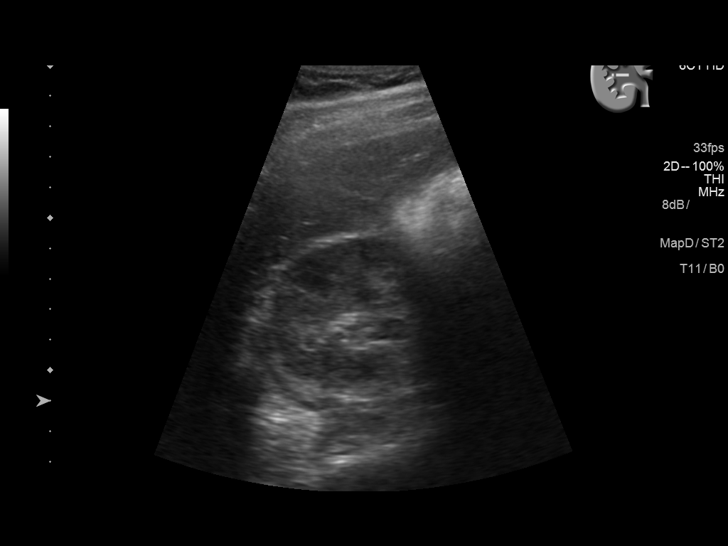
[im 15/33]
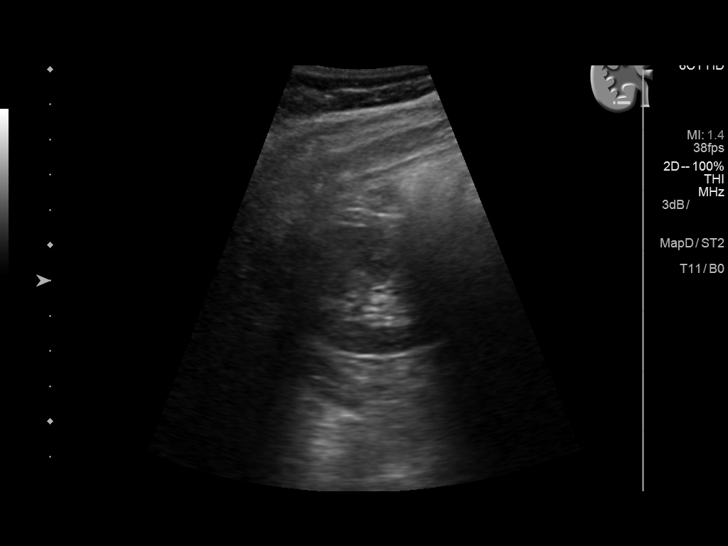
[im 18/33]
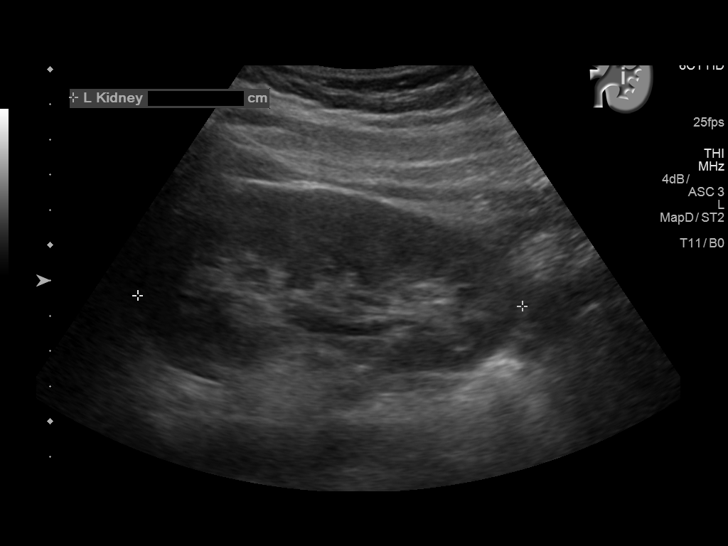
[im 21/33]
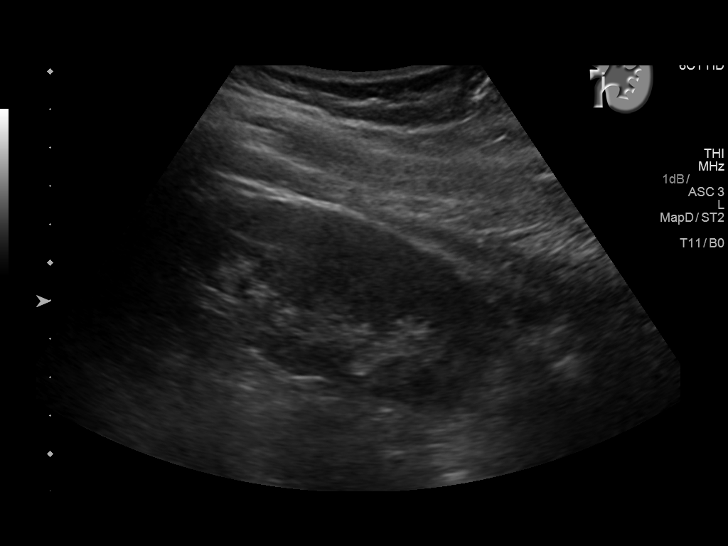
[im 22/33]
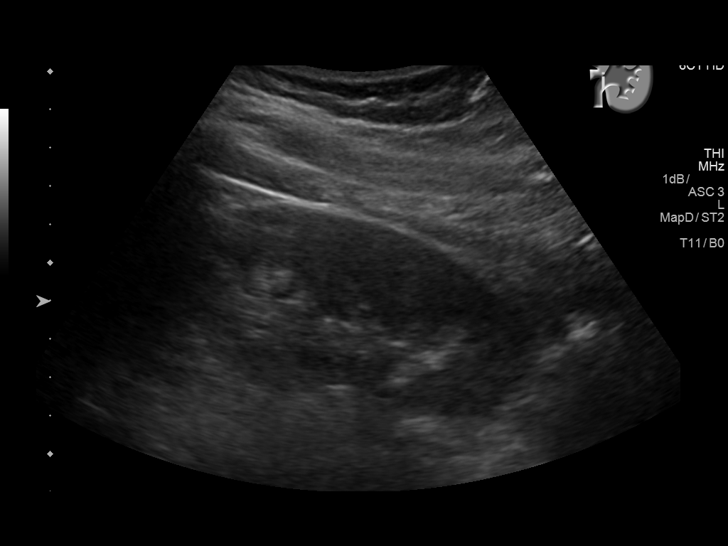
[im 25/33]
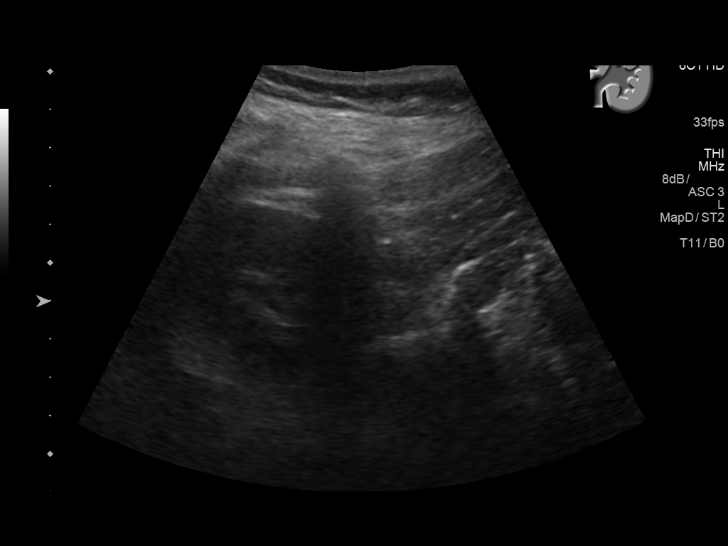
[im 27/33]
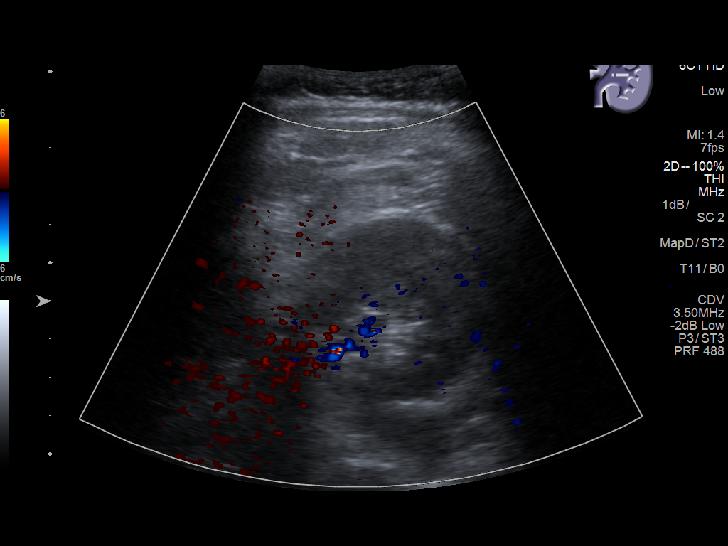
[im 30/33]
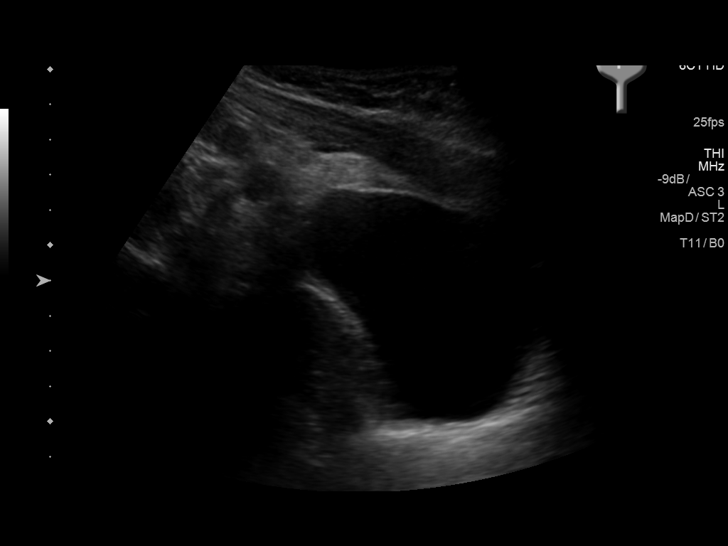
[im 33/33]
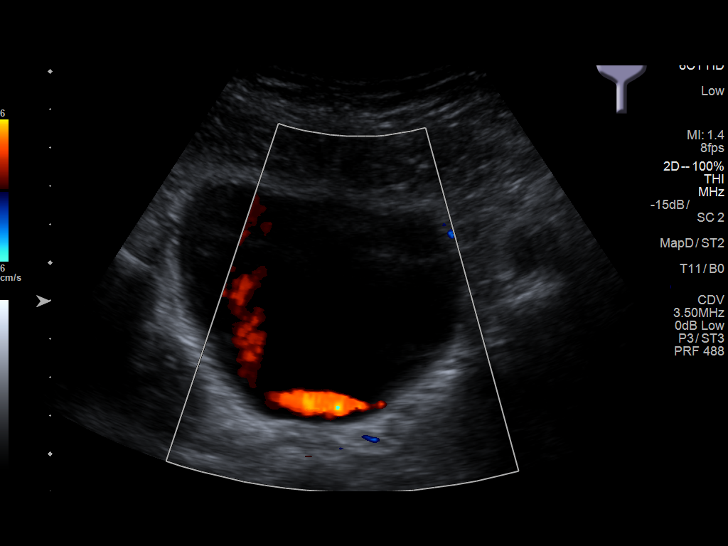

[14 of 25 positions shown; findings below may reference images not displayed]

FINDINGS: Right Kidney:

Length: 11.4 cm. Echogenicity within normal limits. No mass or
hydronephrosis visualized.

Left Kidney:

Length: 10.9 cm. Echogenicity within normal limits. No mass or
hydronephrosis visualized.

Bladder:

Normal.  Normal bilateral ureteral jets visualized.
IMPRESSION: Normal renal ultrasound.

## 2017-04-27 ENCOUNTER — Observation Stay
Admission: EM | Admit: 2017-04-27 | Discharge: 2017-04-27 | Disposition: A | Discharge status: Home / Self Care | Payer: 59 | Attending: Obstetrics and Gynecology | Admitting: Obstetrics and Gynecology

## 2017-04-27 ENCOUNTER — Encounter: Payer: Self-pay | Admitting: Obstetrics and Gynecology

## 2017-04-27 ENCOUNTER — Other Ambulatory Visit: Payer: Self-pay

## 2017-04-27 DIAGNOSIS — Z880 Allergy status to penicillin: Secondary | ICD-10-CM | POA: Diagnosis not present

## 2017-04-27 DIAGNOSIS — Z888 Allergy status to other drugs, medicaments and biological substances status: Secondary | ICD-10-CM | POA: Diagnosis not present

## 2017-04-27 DIAGNOSIS — Z3A28 28 weeks gestation of pregnancy: Secondary | ICD-10-CM | POA: Diagnosis not present

## 2017-04-27 DIAGNOSIS — O4693 Antepartum hemorrhage, unspecified, third trimester: Secondary | ICD-10-CM | POA: Diagnosis present

## 2017-04-27 DIAGNOSIS — O2343 Unspecified infection of urinary tract in pregnancy, third trimester: Principal | ICD-10-CM | POA: Insufficient documentation

## 2017-04-27 LAB — URINALYSIS, ROUTINE W REFLEX MICROSCOPIC
BILIRUBIN URINE: NEGATIVE
GLUCOSE, UA: NEGATIVE mg/dL
HGB URINE DIPSTICK: NEGATIVE
Ketones, ur: NEGATIVE mg/dL
Leukocytes, UA: NEGATIVE
Nitrite: NEGATIVE
PROTEIN: NEGATIVE mg/dL
Specific Gravity, Urine: 1.006 (ref 1.005–1.030)
pH: 7 (ref 5.0–8.0)

## 2017-04-27 LAB — WET PREP, GENITAL
SPERM: NONE SEEN
Trich, Wet Prep: NONE SEEN
YEAST WET PREP: NONE SEEN

## 2017-04-27 NOTE — OB Triage Note (Signed)
Pt present to L&D with c/o vaginal bleeding when wiping after voiding at 1900 this evening. Pt since has not noted additional bleeding. Pt denies decreased fetal movement, leaking of fluid, or contractions at this time. Toco and EFM placed, will monitor closely.

## 2017-04-27 NOTE — Discharge Summary (Signed)
Patient ID: Jamie [name] MRN: [id number] DOB/AGE: [birth year] 27 y.o.  Admit date: 04/27/2017 Discharge date: 04/27/2017  Admission Diagnoses: vaginal bleeding in 3rd trimester  Discharge Diagnoses:   Prenatal Procedures: NST  Consults: none  Significant Diagnostic Studies:  Results for orders placed or performed during the hospital encounter of 04/27/17 (from the past 168 hour(s))  Urinalysis, Routine w reflex microscopic   Collection Time: 04/27/17  9:34 PM  Result Value Ref Range   Color, Urine STRAW (A) YELLOW   APPearance CLEAR (A) CLEAR   Specific Gravity, Urine 1.006 1.005 - 1.030   pH 7.0 5.0 - 8.0   Glucose, UA NEGATIVE NEGATIVE mg/dL   Hgb urine dipstick NEGATIVE NEGATIVE   Bilirubin Urine NEGATIVE NEGATIVE   Ketones, ur NEGATIVE NEGATIVE mg/dL   Protein, ur NEGATIVE NEGATIVE mg/dL   Nitrite NEGATIVE NEGATIVE   Leukocytes, UA NEGATIVE NEGATIVE    Treatments: SSE- wet prep; UA with culture.  Hospital Course:  This is a 27 y.o. G3P0020 with IUP at [redacted]w[redacted]d admitted for vaginal bleeding noted at home around 1900. Has noticed no further bleeding since that time. Most recent IC was approx 5d ago, denies LOF, contractions, feeling + FM.   Preg c/b:  1. Currently being treated for UTI on day 6 of macrobid.   2.RH Negative- given rhogam on 1/30.    Cat I tracing without contractions, no bright red Vag bleeding noted.  SSE done by CNM, scant amt pink spotting noted on speculum, mod amt vaginal discharge noted with some erythema of vaginal walls. Wet prep obtained and sent to lab. Cx was noted to be closed/long with fetal presenting part out of pelvis.  She was observed, fetal heart rate monitoring remained reassuring, and she had no signs/symptoms of  preterm labor or other maternal-fetal concerns.  She was deemed stable for discharge to home with outpatient follow up as scheduled tomorrow in office for 3h glucose tolerance test. .  Discharge Physical Exam:  LMP 10/10/2016  (Exact Date)   General: NAD CV: RRR Pulm: CTABL, nl effort ABD: s/nd/nt, gravid DVT Evaluation: LE non-ttp, no evidence of DVT on exam.  SVE: 0/0/-4 FHT: Cat I tracing; baseline 145 bpm with moderate variability, no decels, + accels.  TOCO: no UCs or irritability.   Wet prep: pending UA: negative   Discharge Condition: Stable  Disposition: 01-Home or Self Care   Allergies as of 04/27/2017      Reactions   Amoxicillin Hives   Penicillins Hives   Adhesive [tape] Rash   Round band aide Paper tape is OK. Round band aide Paper tape is OK.      Medication List    TAKE these medications   butalbital-acetaminophen-caffeine 50-325-40-30 MG capsule Commonly known as:  FIORICET WITH CODEINE Take 1 capsule by mouth every 4 (four) hours as needed for headache.   multivitamin-prenatal 27-0.8 MG Tabs tablet Take 1 tablet by mouth daily at 12 noon.   nitrofurantoin (macrocrystal-monohydrate) 100 MG capsule Commonly known as:  MACROBID Take 100 mg by mouth 2 (two) times daily.        Signed: ----- McVey, Jackson, CNM 04/27/17  10:02 PM

## 2017-04-27 NOTE — Discharge Instructions (Signed)
Please call or return to the [facility] with any of the following: Vaginal Bleeding Leaking of Fluid Contractions every 3-5 mins for an hour Decreased Fetal Movement

## 2017-04-29 LAB — CULTURE, OB URINE: Culture: NO GROWTH

## 2017-07-15 ENCOUNTER — Other Ambulatory Visit: Payer: Self-pay | Admitting: Obstetrics & Gynecology

## 2017-07-16 ENCOUNTER — Encounter
Admission: EM | Disposition: A | Discharge status: Home / Self Care | Payer: Self-pay | Source: Home / Self Care | Attending: Obstetrics and Gynecology

## 2017-07-16 ENCOUNTER — Inpatient Hospital Stay: Payer: 59 | Admitting: Registered Nurse

## 2017-07-16 ENCOUNTER — Inpatient Hospital Stay
Admission: EM | Admit: 2017-07-16 | Discharge: 2017-07-18 | DRG: 788 | Disposition: A | Discharge status: Home / Self Care | Payer: 59 | Attending: Obstetrics and Gynecology | Admitting: Obstetrics and Gynecology

## 2017-07-16 ENCOUNTER — Other Ambulatory Visit: Payer: Self-pay

## 2017-07-16 ENCOUNTER — Encounter: Payer: Self-pay | Admitting: *Deleted

## 2017-07-16 DIAGNOSIS — Z3A39 39 weeks gestation of pregnancy: Secondary | ICD-10-CM

## 2017-07-16 DIAGNOSIS — D649 Anemia, unspecified: Secondary | ICD-10-CM | POA: Diagnosis present

## 2017-07-16 DIAGNOSIS — Z880 Allergy status to penicillin: Secondary | ICD-10-CM | POA: Diagnosis not present

## 2017-07-16 DIAGNOSIS — O99824 Streptococcus B carrier state complicating childbirth: Secondary | ICD-10-CM | POA: Diagnosis present

## 2017-07-16 DIAGNOSIS — K219 Gastro-esophageal reflux disease without esophagitis: Secondary | ICD-10-CM | POA: Diagnosis present

## 2017-07-16 DIAGNOSIS — O9902 Anemia complicating childbirth: Secondary | ICD-10-CM | POA: Diagnosis present

## 2017-07-16 DIAGNOSIS — O9962 Diseases of the digestive system complicating childbirth: Secondary | ICD-10-CM | POA: Diagnosis present

## 2017-07-16 DIAGNOSIS — Z3483 Encounter for supervision of other normal pregnancy, third trimester: Secondary | ICD-10-CM | POA: Diagnosis present

## 2017-07-16 DIAGNOSIS — Z9889 Other specified postprocedural states: Secondary | ICD-10-CM

## 2017-07-16 LAB — CREATININE, SERUM: Creatinine, Ser: 0.51 mg/dL (ref 0.44–1.00)

## 2017-07-16 LAB — CBC
HCT: 36.2 % (ref 35.0–47.0)
Hemoglobin: 12.2 g/dL (ref 12.0–16.0)
MCH: 27.8 pg (ref 26.0–34.0)
MCHC: 33.8 g/dL (ref 32.0–36.0)
MCV: 82.4 fL (ref 80.0–100.0)
PLATELETS: 310 10*3/uL (ref 150–440)
RBC: 4.39 MIL/uL (ref 3.80–5.20)
RDW: 14.1 % (ref 11.5–14.5)
WBC: 19.5 10*3/uL — ABNORMAL HIGH (ref 3.6–11.0)

## 2017-07-16 SURGERY — Surgical Case
Anesthesia: Epidural | Site: Abdomen | Wound class: Clean

## 2017-07-16 MED ORDER — OXYTOCIN 40 UNITS IN LACTATED RINGERS INFUSION - SIMPLE MED
2.5000 [IU]/h | INTRAVENOUS | Status: AC
  Administered 2017-07-16: 2.5 [IU]/h via INTRAVENOUS

## 2017-07-16 MED ORDER — MISOPROSTOL 200 MCG PO TABS
ORAL_TABLET | ORAL | Status: AC
  Filled 2017-07-16: qty 5

## 2017-07-16 MED ORDER — ACETAMINOPHEN 325 MG PO TABS
650.0000 mg | ORAL_TABLET | Freq: Four times a day (QID) | ORAL | Status: AC
  Administered 2017-07-16 – 2017-07-17 (×4): 650 mg via ORAL
  Filled 2017-07-16 (×4): qty 2

## 2017-07-16 MED ORDER — DIPHENHYDRAMINE HCL 25 MG PO CAPS: 25.0000 mg | ORAL_CAPSULE | Freq: Four times a day (QID) | ORAL | Status: DC | PRN

## 2017-07-16 MED ORDER — OXYTOCIN 40 UNITS IN LACTATED RINGERS INFUSION - SIMPLE MED
2.5000 [IU]/h | INTRAVENOUS | Status: DC
  Administered 2017-07-16: 100 mL via INTRAVENOUS
  Administered 2017-07-16: 500 mL via INTRAVENOUS

## 2017-07-16 MED ORDER — ONDANSETRON HCL 4 MG/2ML IJ SOLN
4.0000 mg | Freq: Four times a day (QID) | INTRAMUSCULAR | Status: DC | PRN
  Administered 2017-07-16: 4 mg via INTRAVENOUS
  Filled 2017-07-16: qty 2

## 2017-07-16 MED ORDER — CLINDAMYCIN PHOSPHATE 900 MG/50ML IV SOLN
900.0000 mg | INTRAVENOUS | Status: AC
  Administered 2017-07-16: 900 mg via INTRAVENOUS
  Filled 2017-07-16: qty 50

## 2017-07-16 MED ORDER — ONDANSETRON HCL 4 MG/2ML IJ SOLN
INTRAMUSCULAR | Status: AC
Start: 2017-07-16 — End: 2017-07-16
  Filled 2017-07-16: qty 2

## 2017-07-16 MED ORDER — LIDOCAINE HCL (PF) 2 % IJ SOLN
INTRAMUSCULAR | Status: DC | PRN
  Administered 2017-07-16 (×2): 100 mg via INTRADERMAL
  Administered 2017-07-16: 60 mg via INTRADERMAL

## 2017-07-16 MED ORDER — FENTANYL CITRATE (PF) 100 MCG/2ML IJ SOLN
INTRAMUSCULAR | Status: AC
  Filled 2017-07-16: qty 2

## 2017-07-16 MED ORDER — TETANUS-DIPHTH-ACELL PERTUSSIS 5-2.5-18.5 LF-MCG/0.5 IM SUSP: 0.5000 mL | Freq: Once | INTRAMUSCULAR | Status: DC

## 2017-07-16 MED ORDER — SOD CITRATE-CITRIC ACID 500-334 MG/5ML PO SOLN
30.0000 mL | ORAL | Status: DC | PRN
  Administered 2017-07-16: 30 mL via ORAL
  Filled 2017-07-16: qty 15

## 2017-07-16 MED ORDER — BUPIVACAINE HCL (PF) 0.25 % IJ SOLN
INTRAMUSCULAR | Status: DC | PRN
  Administered 2017-07-16: 3 mL via EPIDURAL
  Administered 2017-07-16: 4 mL via EPIDURAL

## 2017-07-16 MED ORDER — SODIUM CHLORIDE 0.9% FLUSH: 3.0000 mL | INTRAVENOUS | Status: DC | PRN

## 2017-07-16 MED ORDER — AMMONIA AROMATIC IN INHA
RESPIRATORY_TRACT | Status: AC
  Filled 2017-07-16: qty 10

## 2017-07-16 MED ORDER — DIPHENHYDRAMINE HCL 25 MG PO CAPS: 25.0000 mg | ORAL_CAPSULE | ORAL | Status: DC | PRN

## 2017-07-16 MED ORDER — BUPIVACAINE LIPOSOME 1.3 % IJ SUSP
20.0000 mL | Freq: Once | INTRAMUSCULAR | Status: DC
  Filled 2017-07-16: qty 20

## 2017-07-16 MED ORDER — SIMETHICONE 80 MG PO CHEW
80.0000 mg | CHEWABLE_TABLET | ORAL | Status: DC
  Administered 2017-07-17: 80 mg via ORAL
  Filled 2017-07-16: qty 1

## 2017-07-16 MED ORDER — NALBUPHINE HCL 10 MG/ML IJ SOLN: 5.0000 mg | Freq: Once | INTRAMUSCULAR | Status: DC | PRN

## 2017-07-16 MED ORDER — ACETAMINOPHEN 325 MG PO TABS
650.0000 mg | ORAL_TABLET | ORAL | Status: DC | PRN
  Administered 2017-07-16: 650 mg via ORAL
  Filled 2017-07-16: qty 2

## 2017-07-16 MED ORDER — IBUPROFEN 600 MG PO TABS
600.0000 mg | ORAL_TABLET | Freq: Four times a day (QID) | ORAL | Status: DC
  Administered 2017-07-17 – 2017-07-18 (×4): 600 mg via ORAL
  Filled 2017-07-16 (×4): qty 1

## 2017-07-16 MED ORDER — SODIUM CHLORIDE 0.9 % IV SOLN
500.0000 mg | Freq: Once | INTRAVENOUS | Status: AC
  Administered 2017-07-16: 500 mg via INTRAVENOUS
  Filled 2017-07-16: qty 500

## 2017-07-16 MED ORDER — OXYCODONE HCL 5 MG PO TABS: 10.0000 mg | ORAL_TABLET | ORAL | Status: DC | PRN

## 2017-07-16 MED ORDER — OXYCODONE HCL 5 MG PO TABS
5.0000 mg | ORAL_TABLET | ORAL | Status: DC | PRN
Start: 2017-07-16 — End: 2017-07-18
  Administered 2017-07-18 (×2): 5 mg via ORAL
  Filled 2017-07-16 (×3): qty 1

## 2017-07-16 MED ORDER — SIMETHICONE 80 MG PO CHEW
80.0000 mg | CHEWABLE_TABLET | Freq: Three times a day (TID) | ORAL | Status: DC
  Administered 2017-07-16 – 2017-07-18 (×5): 80 mg via ORAL
  Filled 2017-07-16 (×5): qty 1

## 2017-07-16 MED ORDER — SIMETHICONE 80 MG PO CHEW: 80.0000 mg | CHEWABLE_TABLET | ORAL | Status: DC | PRN

## 2017-07-16 MED ORDER — ONDANSETRON HCL 4 MG/2ML IJ SOLN
INTRAMUSCULAR | Status: DC | PRN
  Administered 2017-07-16: 4 mg via INTRAVENOUS

## 2017-07-16 MED ORDER — SODIUM CHLORIDE 0.9 % IJ SOLN
INTRAMUSCULAR | Status: AC
  Filled 2017-07-16: qty 50

## 2017-07-16 MED ORDER — MORPHINE SULFATE (PF) 0.5 MG/ML IJ SOLN
INTRAMUSCULAR | Status: AC
  Filled 2017-07-16: qty 10

## 2017-07-16 MED ORDER — VANCOMYCIN HCL IN DEXTROSE 1-5 GM/200ML-% IV SOLN
1000.0000 mg | Freq: Two times a day (BID) | INTRAVENOUS | Status: DC
  Administered 2017-07-16: 1000 mg via INTRAVENOUS
  Filled 2017-07-16 (×2): qty 200

## 2017-07-16 MED ORDER — CARBOPROST TROMETHAMINE 250 MCG/ML IM SOLN
INTRAMUSCULAR | Status: AC
  Filled 2017-07-16: qty 1

## 2017-07-16 MED ORDER — LACTATED RINGERS IV SOLN: INTRAVENOUS | Status: DC

## 2017-07-16 MED ORDER — ZOLPIDEM TARTRATE 5 MG PO TABS: 5.0000 mg | ORAL_TABLET | Freq: Every evening | ORAL | Status: DC | PRN

## 2017-07-16 MED ORDER — BUPIVACAINE LIPOSOME 1.3 % IJ SUSP
INTRAMUSCULAR | Status: DC | PRN
  Administered 2017-07-16: 85 mL

## 2017-07-16 MED ORDER — KETOROLAC TROMETHAMINE 30 MG/ML IJ SOLN
30.0000 mg | Freq: Four times a day (QID) | INTRAMUSCULAR | Status: DC
  Administered 2017-07-16: 30 mg via INTRAVENOUS
  Filled 2017-07-16: qty 1

## 2017-07-16 MED ORDER — PRENATAL MULTIVITAMIN CH
1.0000 | ORAL_TABLET | Freq: Every day | ORAL | Status: DC
  Administered 2017-07-17 – 2017-07-18 (×2): 1 via ORAL
  Filled 2017-07-16 (×2): qty 1

## 2017-07-16 MED ORDER — TERBUTALINE SULFATE 1 MG/ML IJ SOLN: 0.2500 mg | Freq: Once | INTRAMUSCULAR | Status: DC | PRN

## 2017-07-16 MED ORDER — FENTANYL 2.5 MCG/ML W/ROPIVACAINE 0.15% IN NS 100 ML EPIDURAL (ARMC)
EPIDURAL | Status: AC
  Filled 2017-07-16: qty 100

## 2017-07-16 MED ORDER — FENTANYL CITRATE (PF) 100 MCG/2ML IJ SOLN
INTRAMUSCULAR | Status: DC | PRN
  Administered 2017-07-16 (×2): 50 ug via INTRAVENOUS
  Administered 2017-07-16: 100 ug via EPIDURAL

## 2017-07-16 MED ORDER — METHYLERGONOVINE MALEATE 0.2 MG/ML IJ SOLN
INTRAMUSCULAR | Status: DC | PRN
  Administered 2017-07-16: 0.2 mg via INTRAMUSCULAR

## 2017-07-16 MED ORDER — FENTANYL 2.5 MCG/ML W/ROPIVACAINE 0.15% IN NS 100 ML EPIDURAL (ARMC)
EPIDURAL | Status: DC | PRN
  Administered 2017-07-16: 12 mL/h via EPIDURAL

## 2017-07-16 MED ORDER — NALOXONE HCL 0.4 MG/ML IJ SOLN: 0.4000 mg | INTRAMUSCULAR | Status: DC | PRN

## 2017-07-16 MED ORDER — KETOROLAC TROMETHAMINE 30 MG/ML IJ SOLN: 30.0000 mg | Freq: Four times a day (QID) | INTRAMUSCULAR | Status: AC

## 2017-07-16 MED ORDER — KETOROLAC TROMETHAMINE 30 MG/ML IJ SOLN
30.0000 mg | Freq: Four times a day (QID) | INTRAMUSCULAR | Status: AC
  Administered 2017-07-16 – 2017-07-17 (×3): 30 mg via INTRAVENOUS
  Filled 2017-07-16 (×3): qty 1

## 2017-07-16 MED ORDER — DIPHENHYDRAMINE HCL 50 MG/ML IJ SOLN: 12.5000 mg | INTRAMUSCULAR | Status: DC | PRN

## 2017-07-16 MED ORDER — OXYCODONE HCL 5 MG PO TABS: 5.0000 mg | ORAL_TABLET | ORAL | Status: DC | PRN

## 2017-07-16 MED ORDER — LIDOCAINE HCL (PF) 2 % IJ SOLN
INTRAMUSCULAR | Status: AC
  Filled 2017-07-16: qty 10

## 2017-07-16 MED ORDER — FENTANYL CITRATE (PF) 100 MCG/2ML IJ SOLN: 50.0000 ug | INTRAMUSCULAR | Status: DC | PRN

## 2017-07-16 MED ORDER — MISOPROSTOL 25 MCG QUARTER TABLET: 25.0000 ug | ORAL_TABLET | ORAL | Status: DC | PRN

## 2017-07-16 MED ORDER — LIDOCAINE HCL (PF) 2 % IJ SOLN
INTRAMUSCULAR | Status: AC
Start: 2017-07-16 — End: 2017-07-16
  Filled 2017-07-16: qty 10

## 2017-07-16 MED ORDER — LIDOCAINE HCL (PF) 1 % IJ SOLN
30.0000 mL | INTRAMUSCULAR | Status: DC | PRN
  Filled 2017-07-16 (×2): qty 30

## 2017-07-16 MED ORDER — SENNOSIDES-DOCUSATE SODIUM 8.6-50 MG PO TABS
2.0000 | ORAL_TABLET | ORAL | Status: DC
  Administered 2017-07-17 – 2017-07-18 (×2): 2 via ORAL
  Filled 2017-07-16 (×2): qty 2

## 2017-07-16 MED ORDER — ACETAMINOPHEN 325 MG PO TABS
650.0000 mg | ORAL_TABLET | ORAL | Status: DC | PRN
  Administered 2017-07-17: 650 mg via ORAL
  Filled 2017-07-16 (×2): qty 2

## 2017-07-16 MED ORDER — OXYTOCIN 40 UNITS IN LACTATED RINGERS INFUSION - SIMPLE MED
1.0000 m[IU]/min | INTRAVENOUS | Status: DC
  Administered 2017-07-16: 2 m[IU]/min via INTRAVENOUS
  Filled 2017-07-16 (×2): qty 1000

## 2017-07-16 MED ORDER — IBUPROFEN 600 MG PO TABS: 600.0000 mg | ORAL_TABLET | Freq: Four times a day (QID) | ORAL | Status: DC

## 2017-07-16 MED ORDER — COCONUT OIL OIL
1.0000 "application " | TOPICAL_OIL | Status: DC | PRN
  Administered 2017-07-17: 1 via TOPICAL
  Filled 2017-07-16: qty 120

## 2017-07-16 MED ORDER — KETOROLAC TROMETHAMINE 30 MG/ML IJ SOLN: 30.0000 mg | Freq: Four times a day (QID) | INTRAMUSCULAR | Status: DC

## 2017-07-16 MED ORDER — MEPERIDINE HCL 25 MG/ML IJ SOLN: 6.2500 mg | INTRAMUSCULAR | Status: DC | PRN

## 2017-07-16 MED ORDER — ONDANSETRON HCL 4 MG/2ML IJ SOLN: 4.0000 mg | Freq: Three times a day (TID) | INTRAMUSCULAR | Status: DC | PRN

## 2017-07-16 MED ORDER — LIDOCAINE HCL (PF) 1 % IJ SOLN
INTRAMUSCULAR | Status: DC | PRN
  Administered 2017-07-16: 3 mL via INTRADERMAL

## 2017-07-16 MED ORDER — DIBUCAINE 1 % RE OINT: 1.0000 "application " | TOPICAL_OINTMENT | RECTAL | Status: DC | PRN

## 2017-07-16 MED ORDER — MENTHOL 3 MG MT LOZG
1.0000 | LOZENGE | OROMUCOSAL | Status: DC | PRN
  Filled 2017-07-16: qty 9

## 2017-07-16 MED ORDER — WITCH HAZEL-GLYCERIN EX PADS: 1.0000 "application " | MEDICATED_PAD | CUTANEOUS | Status: DC | PRN

## 2017-07-16 MED ORDER — ONDANSETRON HCL 4 MG/2ML IJ SOLN
INTRAMUSCULAR | Status: AC
  Filled 2017-07-16: qty 2

## 2017-07-16 MED ORDER — GENTAMICIN SULFATE 40 MG/ML IJ SOLN
2.0000 mg/kg | Freq: Once | INTRAVENOUS | Status: AC
  Administered 2017-07-16: 140 mg via INTRAVENOUS
  Filled 2017-07-16: qty 3.5

## 2017-07-16 MED ORDER — NALBUPHINE HCL 10 MG/ML IJ SOLN: 5.0000 mg | INTRAMUSCULAR | Status: DC | PRN

## 2017-07-16 MED ORDER — LACTATED RINGERS IV SOLN
500.0000 mL | INTRAVENOUS | Status: DC | PRN
  Administered 2017-07-16: 500 mL via INTRAVENOUS
  Administered 2017-07-16: 1000 mL via INTRAVENOUS
  Administered 2017-07-16: 13:00:00 via INTRAVENOUS

## 2017-07-16 MED ORDER — LIDOCAINE-EPINEPHRINE (PF) 1.5 %-1:200000 IJ SOLN
INTRAMUSCULAR | Status: DC | PRN
  Administered 2017-07-16: 3 mL via EPIDURAL

## 2017-07-16 MED ORDER — LIDOCAINE HCL 2 % IJ SOLN
INTRAMUSCULAR | Status: AC
  Filled 2017-07-16: qty 10

## 2017-07-16 MED ORDER — MORPHINE SULFATE (PF) 0.5 MG/ML IJ SOLN
INTRAMUSCULAR | Status: DC | PRN
  Administered 2017-07-16: 3 mg via EPIDURAL

## 2017-07-16 MED ORDER — OXYTOCIN BOLUS FROM INFUSION: 500.0000 mL | Freq: Once | INTRAVENOUS | Status: DC

## 2017-07-16 MED ORDER — MISOPROSTOL 200 MCG PO TABS
ORAL_TABLET | ORAL | Status: AC
  Filled 2017-07-16: qty 4

## 2017-07-16 MED ORDER — METHYLERGONOVINE MALEATE 0.2 MG/ML IJ SOLN
INTRAMUSCULAR | Status: AC
  Filled 2017-07-16: qty 1

## 2017-07-16 SURGICAL SUPPLY — 24 items
BARRIER ADHS 3X4 INTERCEED (GAUZE/BANDAGES/DRESSINGS) ×2 IMPLANT
CANISTER SUCT 3000ML PPV (MISCELLANEOUS) ×2 IMPLANT
CHLORAPREP W/TINT 26ML (MISCELLANEOUS) ×2 IMPLANT
DRSG TELFA 3X8 NADH (GAUZE/BANDAGES/DRESSINGS) ×2 IMPLANT
ELECT CAUTERY BLADE 6.4 (BLADE) ×2 IMPLANT
ELECT REM PT RETURN 9FT ADLT (ELECTROSURGICAL) ×2
ELECTRODE REM PT RTRN 9FT ADLT (ELECTROSURGICAL) ×1 IMPLANT
GAUZE SPONGE 4X4 12PLY STRL (GAUZE/BANDAGES/DRESSINGS) ×2 IMPLANT
GLOVE BIO SURGEON STRL SZ8 (GLOVE) ×2 IMPLANT
GOWN STRL REUS W/ TWL LRG LVL3 (GOWN DISPOSABLE) ×2 IMPLANT
GOWN STRL REUS W/ TWL XL LVL3 (GOWN DISPOSABLE) ×1 IMPLANT
GOWN STRL REUS W/TWL LRG LVL3 (GOWN DISPOSABLE) ×2
GOWN STRL REUS W/TWL XL LVL3 (GOWN DISPOSABLE) ×1
NEEDLE HYPO 22GX1.5 SAFETY (NEEDLE) ×2 IMPLANT
NS IRRIG 1000ML POUR BTL (IV SOLUTION) ×2 IMPLANT
PACK C SECTION AR (MISCELLANEOUS) ×2 IMPLANT
PAD OB MATERNITY 4.3X12.25 (PERSONAL CARE ITEMS) ×2 IMPLANT
PAD PREP 24X41 OB/GYN DISP (PERSONAL CARE ITEMS) ×2 IMPLANT
STAPLER INSORB 30 2030 C-SECTI (MISCELLANEOUS) ×2 IMPLANT
STRAP SAFETY 5IN WIDE (MISCELLANEOUS) ×2 IMPLANT
SUT CHROMIC 1 CTX 36 (SUTURE) ×6 IMPLANT
SUT PLAIN GUT 0 (SUTURE) ×4 IMPLANT
SUT VIC AB 0 CT1 36 (SUTURE) ×4 IMPLANT
SYR 30ML LL (SYRINGE) ×8 IMPLANT

## 2017-07-16 NOTE — OB Triage Note (Signed)
Recvd pt from ED. Pt c/o contractions every 3-5 min for the last hour. Pt is unsure if she is leaking fluid. Pt states she had an appointment yesterday and they stripped her membranes. Feeling baby move ok. Rates pain a 6 out of 10.

## 2017-07-16 NOTE — Brief Op Note (Signed)
07/16/2017  3:05 PM  PATIENT:  Jamie [name]  28 y.o. female  PRE-OPERATIVE DIAGNOSIS:  Fetal intolerance to labor  POST-OPERATIVE DIAGNOSIS:  Fetal intolerance to labor  PROCEDURE:  Procedure(s) with comments: CESAREAN SECTION (N/A) - Girl born @ 60 Apgars: 9/9 Weight: 7lbs 7 oz  SURGEON:  Surgeon(s) and Role:    * Schermerhorn, Gwen Her, MD - Primary  PHYSICIAN ASSISTANT: Mcvey , CNM  ASSISTANTS: none   ANESTHESIA:   epidural  EBL:  500 mL   BLOOD ADMINISTERED:none  DRAINS: Urinary Catheter (Foley)   LOCAL MEDICATIONS USED:  MARCAINE   , BUPIVICAINE , Amount: 85cc ml and OTHER  50 cc ns solution   SPECIMEN:  No Specimen  DISPOSITION OF SPECIMEN:  N/A  COUNTS:  YES  TOURNIQUET:  * No tourniquets in log *  DICTATION: .Other Dictation: Dictation Number verbal   PLAN OF CARE: Admit to inpatient   PATIENT DISPOSITION:  PACU - hemodynamically stable.   Delay start of Pharmacological VTE agent (>24hrs) due to surgical blood loss or risk of bleeding: not applicable

## 2017-07-16 NOTE — Anesthesia Procedure Notes (Signed)
Epidural Patient location during procedure: OB Start time: 07/16/2017 9:30 AM End time: 07/16/2017 9:45 AM  Staffing Anesthesiologist: Emmie Niemann, MD Resident/CRNA: Hedda Slade, CRNA Performed: resident/CRNA   Preanesthetic Checklist Completed: patient identified, site marked, surgical consent, pre-op evaluation, timeout performed, IV checked, risks and benefits discussed and monitors and equipment checked  Epidural Patient position: sitting Prep: ChloraPrep Patient monitoring: heart rate, continuous pulse ox and blood pressure Approach: midline Location: L3-L4 Injection technique: LOR saline  Needle:  Needle type: Tuohy  Needle gauge: 17 G Needle length: 9 cm and 9 Needle insertion depth: 5 cm Catheter type: closed end flexible Catheter size: 19 Gauge Catheter at skin depth: 11 cm Test dose: negative and 1.5% lidocaine with Epi 1:200 K  Assessment Events: blood not aspirated, injection not painful, no injection resistance, negative IV test and no paresthesia  Additional Notes 1 attempt Pt. Evaluated and documentation done after procedure finished. Patient identified. Risks/Benefits/Options discussed with patient including but not limited to bleeding, infection, nerve damage, paralysis, failed block, incomplete pain control, headache, blood pressure changes, nausea, vomiting, reactions to medication both or allergic, itching and postpartum back pain. Confirmed with bedside nurse the patient's most recent platelet count. Confirmed with patient that they are not currently taking any anticoagulation, have any bleeding history or any family history of bleeding disorders. Patient expressed understanding and wished to proceed. All questions were answered. Sterile technique was used throughout the entire procedure. Please see nursing notes for vital signs. Test dose was given through epidural catheter and negative prior to continuing to dose epidural or start infusion. Warning signs  of high block given to the patient including shortness of breath, tingling/numbness in hands, complete motor block, or any concerning symptoms with instructions to call for help. Patient was given instructions on fall risk and not to get out of bed. All questions and concerns addressed with instructions to call with any issues or inadequate analgesia.   Patient tolerated the insertion well without immediate complications.Reason for block:procedure for pain

## 2017-07-16 NOTE — Anesthesia Preprocedure Evaluation (Signed)
Anesthesia Evaluation   Patient awake    Reviewed: Allergy & Precautions, H&P , NPO status , Patient's Chart, lab work & pertinent test results  Airway Mallampati: II       Dental  (+) Teeth Intact   Pulmonary neg pulmonary ROS,    Pulmonary exam normal        Cardiovascular negative cardio ROS Normal cardiovascular exam     Neuro/Psych  Headaches (Occasional migraines ),    GI/Hepatic GERD (takes TUMS PRN)  Medicated,  Endo/Other  negative endocrine ROS  Renal/GU negative Renal ROS  negative genitourinary   Musculoskeletal   Abdominal   Peds  Hematology negative hematology ROS (+)   Anesthesia Other Findings   Reproductive/Obstetrics (+) Pregnancy                             Anesthesia Physical Anesthesia Plan  ASA: II  Anesthesia Plan: Epidural   Post-op Pain Management:    Induction:   PONV Risk Score and Plan: 2  Airway Management Planned:   Additional Equipment:   Intra-op Plan:   Post-operative Plan:   Informed Consent: I have reviewed the patients History and Physical, chart, labs and discussed the procedure including the risks, benefits and alternatives for the proposed anesthesia with the patient or authorized representative who has indicated his/her understanding and acceptance.     Plan Discussed with: Anesthesiologist and CRNA  Anesthesia Plan Comments:         Anesthesia Quick Evaluation

## 2017-07-16 NOTE — Lactation Note (Signed)
Lactation Consultation Note  Patient Name: Jamie [name] YFVCB'S Date: 07/16/2017 Reason for consult: Initial assessment   Maternal Data  Mother has taken a breast feeding class and wants to breast feed.  Feeding    LATCH Score Latch: Grasps breast easily, tongue down, lips flanged, rhythmical sucking.  Audible Swallowing: Spontaneous and intermittent  Type of Nipple: Everted at rest and after stimulation  Comfort (Breast/Nipple): Soft / non-tender  Hold (Positioning): Assistance needed to correctly position infant at breast and maintain latch.  LATCH Score: 9  Interventions Interventions: Breast feeding basics reviewed  Lactation Tools Discussed/Used  No equipment needed  Consult Status Consult Status: Follow-up    Daryel November 07/16/2017, 4:09 PM

## 2017-07-16 NOTE — Progress Notes (Addendum)
Labor Progress Note  Jamie Jamie [name] is Jamie [name] 27 y.o. G3P0020 at [redacted]w[redacted]d byLMP and c/w [redacted]w[redacted]d [place]. She presents to L&D for SROM in early labor.    Subjective: comfortable with epidural. CNM to bedside for recurrent late decels.   Objective: BP (!) 89/59   Pulse 76   Temp 98.6 F (37 C) (Oral)   Resp 18   Ht 5' (1.524 m)   Wt 153 lb (69.4 kg)   LMP 10/10/2016 (Exact Date)   SpO2 98%   BMI 29.88 kg/m  Notable VS details: reviewed  Fetal Assessment: FHT:  FHR: 140 bpm, variability: min-mod,  accelerations:  Abscent,  decelerations:  Present late decels Category/reactivity:  Category II UC:   regular, every 2-4 minutes  Pitocin DC'd, position changed, SVE rechecked with no cervical change. IUPC placed with +fluid return. Oxygen mask placed/IV bolus.   SVE:   4/60/-2; soft/mid Membrane status: ruptured x 8hrs Amniotic color: clear  Labs: Lab Results  Component Value Date   WBC 19.5 (H) 07/16/2017   HGB 12.2 07/16/2017   HCT 36.2 07/16/2017   MCV 82.4 07/16/2017   PLT 310 07/16/2017    Assessment / Plan: early labor, Cat II tracing.   Labor: no cervical change, decreasing variability, IUPC placed with adequate MVUs (pitocin currently off). Dr Ouida Sills notified.   Fetal Wellbeing:  Category II Pain Control:  Epidural I/D:  GBS Pos- Vanc q12h   Jamie Jamie [name], CNM 07/16/2017, 1:19 PM   Addendum: D/W dr Ouida Sills, will pproceed with LTCS for nonreassuring FHR tracing Azithro 500mg  IV; Gent 2mg /kg: CLinda 900mg  IV now.  D/w pt and spouse, no questions and agreed with POC.   Jamie Jamie [name] Jamie [name], CNM 1:30 PM

## 2017-07-16 NOTE — Anesthesia Procedure Notes (Signed)
Date/Time: 07/16/2017 1:58 PM Performed by: Johnna Acosta, CRNA Pre-anesthesia Checklist: Emergency Drugs available, Suction available, Patient being monitored and Timeout performed Oxygen Delivery Method: Nasal cannula Preoxygenation: Pre-oxygenation with 100% oxygen

## 2017-07-16 NOTE — Transfer of Care (Signed)
Immediate Anesthesia Transfer of Care Note  Patient: Jamie [name]  Procedure(s) Performed: CESAREAN SECTION (N/A Abdomen)  Patient Location: PACU  Anesthesia Type:Epidural  Level of Consciousness: awake, alert  and oriented  Airway & Oxygen Therapy: Patient Spontanous Breathing  Post-op Assessment: Report given to RN and Post -op Vital signs reviewed and stable  Post vital signs: Reviewed and stable  Last Vitals:  Vitals Value Taken Time  BP 122/71 07/16/2017  3:12 PM  Temp 37.1 C 07/16/2017  3:12 PM  Pulse 81 07/16/2017  3:12 PM  Resp 24 07/16/2017  3:12 PM  SpO2 100 % 07/16/2017  3:12 PM    Last Pain:  Vitals:   07/16/17 1512  TempSrc: Oral  PainSc:          Complications: No apparent anesthesia complications

## 2017-07-16 NOTE — Progress Notes (Signed)
Patient ID: Jamie [name], female   DOB: 20-Aug-1990, 27 y.o.   MRN: [id number] Fetal intolerance to Pitocin . decels and decreased variability .Cx still 4 cm   Proceed to LTCS . Pt has been counseled regarding the risks . Abx for surgical prophylaxis has been ordered .

## 2017-07-16 NOTE — H&P (Addendum)
OB History & Physical   History of Present Illness:  Chief Complaint: started leaking fluid around 0500, then increased contractions.   HPI:  Jamie [name] is a 27 y.o. G2 P4 female at [redacted]w[redacted]d dated by LMP and c/w [redacted]w[redacted]d [place].  She presents to L&D for SROM in early labor.   Active FM onset of ctx @ 0600 currently every 3-5 minutes LOF  / SROM @ 0500 bloody show: scant   Pregnancy Issues: 1. Rh Neg, rec'd rhogam at 28wks 04/22/17 2. Rubella non-immune 3. Mild anemia, on PO Iron  4. GBS Pos- clinda resistant  Maternal Medical History:   Past Medical History:  Diagnosis Date  . Migraine   . Ovarian cyst   . Ovarian cyst 01/2014   left side    Past Surgical History:  Procedure Laterality Date  . DILATION AND CURETTAGE OF UTERUS  01/2014  . LAPAROSCOPIC OVARIAN CYSTECTOMY Left 01/29/2015   Procedure: LAPAROSCOPIC OVARIAN CYSTECTOMY;  Surgeon: Rubie Maid, MD;  Location: [facility] ORS;  Service: Gynecology;  Laterality: Left;  . WISDOM TOOTH EXTRACTION Bilateral 2009   all 4 teeth    Allergies  Allergen Reactions  . Amoxicillin Hives  . Penicillins Hives  . Adhesive [Tape] Rash    Round band aide Paper tape is OK. Round band aide Paper tape is OK.    Prior to Admission medications   Medication Sig Start Date End Date Taking? Authorizing Provider  Prenatal Vit-Fe Fumarate-FA (MULTIVITAMIN-PRENATAL) 27-0.8 MG TABS tablet Take 1 tablet by mouth daily at 12 noon.   Yes [provider]  butalbital-acetaminophen-caffeine (FIORICET WITH CODEINE) 50-325-40-30 MG per capsule Take 1 capsule by mouth every 4 (four) hours as needed for headache.    [provider]  nitrofurantoin, macrocrystal-monohydrate, (MACROBID) 100 MG capsule Take 100 mg by mouth 2 (two) times daily.    [provider]     Prenatal care site: [facility]  Social History: She  reports that she has never smoked. She has never used smokeless tobacco. She reports that she  drinks alcohol. She reports that she does not use drugs.  Family History: family history includes Hypertension in her father.   Review of Systems: A full review of systems was performed and negative except as noted in the HPI.     Physical Exam:  Vital Signs: BP 119/68 (BP Location: Right Arm)   Pulse 79   Temp 98.6 F (37 C) (Oral)   Resp 18   Ht 5' (1.524 m)   Wt 153 lb (69.4 kg)   LMP 10/10/2016 (Exact Date)   BMI 29.88 kg/m  General: no acute distress.  HEENT: normocephalic, atraumatic Heart: regular rate & rhythm.  No murmurs/rubs/gallops Lungs: clear to auscultation bilaterally, normal respiratory effort Abdomen: soft, gravid, non-tender;  EFW: 3500gm Pelvic:  External: Normal external female genitalia; leaking clear fluid, +nitrazine Cervix: 3/50/-2; soft/posterior    Extremities: non-tender, symmetric, no edema bilaterally.  DTRs: 2+  Neurologic: Alert & oriented x 3.    No results found for this or any previous visit (from the past 24 hour(s)).  Pertinent Results:  Prenatal Labs: Blood type/Rh  O Neg  Antibody screen neg  Rubella  Non- Immune  Varicella Immune  RPR NR  HBsAg Neg  HIV NR  GC neg  Chlamydia neg  Genetic screening  declined  1 hour GTT  139  3 hour GTT  [id number]  GBS  Pos- clinda resistant   FHT: 130bpm, + accels, mod variability, no  decels TOCO: [id number]  Cephalic by leopolds and exam  No results found.  Assessment:  Jamie [name] is a 27 y.o. G44P0020 female at [redacted]w[redacted]d with SROM clear fluid at 0500.   Plan:  1. Admit to Labor & Delivery; consents reviewed and obtained  2. Fetal Well being  - Fetal Tracing: Cat I tracing - Group B Streptococcus ppx indicated: pos GBS, clinda resistant, starting Vanc q12h - Presentation: cephalic confirmed by exam/leopolds   3. Routine OB: - Prenatal labs reviewed, as above - Rh O Neg - CBC, T&S, RPR on admit - Clear fluids, IVF  4. Monitoring of Labor -  Contractions: external toco in  place -  Pelvis unproven, adequate for TOL -  Plan for augmentation with Pitocin, minimal cervical change since SROM and irreg UCs.  -  Plan for continuous fetal monitoring  -  Maternal pain control as desired; requesting regional anesthesia - Anticipate vaginal delivery  5. Post Partum Planning: - Infant feeding: Breast - Contraception: IUD, possible spouse vasectomy.   Joei Frangos, [place], CNM 07/16/17 8:27 AM

## 2017-07-16 NOTE — Discharge Summary (Signed)
Obstetric Discharge Summary   Patient ID: Patient Name: Jamie [name] DOB: 02-11-1991 MRN: [id number]  Date of Admission: 07/16/2017 Date of Delivery: 07/16/2017 at 1422 Delivered by: Ouida Sills, MD Date of Discharge: 07/18/2017 Primary OB: [facility]  DUK:GURKYHC'W last menstrual period was 10/10/2016 (exact date). EDC Estimated Date of Delivery: 07/17/17 Gestational Age at Delivery: [redacted]w[redacted]d   Antepartum complications: fetal intolerance to labor  Admitting Diagnosis: labor   Secondary Diagnoses: Patient Active Problem List   Diagnosis Date Noted  . Labor and delivery, indication for care 07/16/2017  . Postoperative state 07/16/2017  . Vaginal bleeding in pregnancy, third trimester 04/27/2017  . Supervision of high risk pregnancy in third trimester 12/15/2016  . Uterine adnexal pain 05/02/2016  . S/P left oophorectomy 06/10/2015  . Rh negative state in antepartum period 12/19/2013    Augmentation: Pitocin Complications: None Intrapartum complications/course: see operative note Delivery Type: primary cesarean section, low transverse incision Anesthesia: epidural Placenta: manual Laceration: none Episiotomy: none  Newborn Data: Live born female  Birth Weight: 7 lb 6.9 oz (3370 g) APGAR: 9, 9  Newborn Delivery   Birth date/time:  07/16/2017 14:22:00 Delivery type:  C-Section, Low Transverse C-section categorization:  Primary      Postpartum Course  Patient had an uncomplicated postpartum course.  By time of discharge on POD#2, her pain was controlled on oral pain medications; she had appropriate lochia and was ambulating, voiding without difficulty and tolerating regular diet.  She was deemed stable for discharge to home.       Labs: CBC Latest Ref Rng & Units 07/18/2017 07/17/2017 07/16/2017  WBC 3.6 - 11.0 K/uL 17.1(H) 22.7(H) 19.5(H)  Hemoglobin 12.0 - 16.0 g/dL 9.4(L) 10.3(L) 12.2  Hematocrit 35.0 - 47.0 % 27.7(L) 30.4(L) 36.2  Platelets 150 - 440 K/uL  273 243 310   O NEG  Physical exam:  BP 105/70 (BP Location: Right Arm)   Pulse 81   Temp 97.9 F (36.6 C) (Oral)   Resp 18   Ht 5' (1.524 m)   Wt 69.4 kg (153 lb)   LMP 10/10/2016 (Exact Date)   SpO2 99%   Breastfeeding? Unknown   BMI 29.88 kg/m  General: alert and no distress Pulm: normal respiratory effort Lochia: appropriate Abdomen: soft, NT Uterine Fundus: firm, below umbilicus Incision: c/d/i, healing well, no significant drainage, no dehiscence, no significant erythema; small area of puckering on left end of incision, no drainage Extremities: No evidence of DVT seen on physical exam. No lower extremity edema.  Disposition: stable, discharge to home Baby Feeding: breastmilk & formula Baby Disposition: home with mom  Contraception: IUD, possible spouse vasectomy  Prenatal Labs:  Blood type/Rh  O Neg  Antibody screen neg  Rubella  Non- Immune  Varicella Immune  RPR NR  HBsAg Neg  HIV NR  GC neg  Chlamydia neg  Genetic screening  declined  1 hour GTT  139  3 hour GTT  90-176-137-24  GBS  Pos- clinda resistant    Rh Immune globulin given: n/a Rubella vaccine ordered to be given before discharge  Tdap vaccine given in AP or PP setting: 05/06/17 Flu vaccine given in AP or PP setting: 12/2006   Plan:  Jamie [name] was discharged to home in good condition. Follow-up appointment at [place] with delivery provider in 2 weeks  Discharge Instructions: Per After Visit Summary. Activity: Advance as tolerated. Pelvic rest for 6 weeks.   Diet: Regular Discharge Medications: Allergies as of 07/18/2017  Reactions   Amoxicillin Hives   Penicillins Hives   Adhesive [tape] Rash   Round band aide Paper tape is OK. Round band aide Paper tape is OK.      Medication List    STOP taking these medications   butalbital-acetaminophen-caffeine 50-325-40-30 MG capsule Commonly known as:  FIORICET WITH CODEINE   nitrofurantoin  (macrocrystal-monohydrate) 100 MG capsule Commonly known as:  MACROBID     TAKE these medications   acetaminophen 325 MG tablet Commonly known as:  TYLENOL Take 2 tablets (650 mg total) by mouth every 4 (four) hours as needed for mild pain or moderate pain.   ferrous sulfate 325 (65 FE) MG tablet Take 1 tablet (325 mg total) by mouth 2 (two) times daily with a meal.   ibuprofen 600 MG tablet Commonly known as:  ADVIL,MOTRIN Take 1 tablet (600 mg total) by mouth every 6 (six) hours as needed for mild pain, moderate pain or cramping.   multivitamin-prenatal 27-0.8 MG Tabs tablet Take 1 tablet by mouth daily at 12 noon.   oxyCODONE 5 MG immediate release tablet Commonly known as:  Oxy IR/ROXICODONE Take 1 tablet (5 mg total) by mouth every 4 (four) hours as needed for moderate pain, severe pain or breakthrough pain.   senna-docusate 8.6-50 MG tablet Commonly known as:  Senokot-S Take 2 tablets by mouth daily as needed for mild constipation.      Outpatient follow up:  Follow-up Information    Schermerhorn, Gwen Her, MD. Schedule an appointment as soon as possible for a visit in 2 week(s).   Specialty:  Obstetrics and Gynecology Contact information: [address] [place] [address] [phone]            Signed:  Lisette Grinder, [facility] 07/18/2017 9:48 AM

## 2017-07-16 NOTE — Anesthesia Post-op Follow-up Note (Signed)
Anesthesia QCDR form completed.        

## 2017-07-16 NOTE — Progress Notes (Signed)
Labor Progress Note: late note entry  Jamie [name] is a 27 y.o. G3P0020 at [redacted]w[redacted]d by LMP and c/w [redacted]w[redacted]d [place].  She presents to L&D for SROM in early labor.    Subjective: comfortable now after epidural. CNM to bedside for late and variable decels.   Objective: BP (!) 89/59   Pulse 76   Temp 98.6 F (37 C) (Oral)   Resp 18   Ht 5' (1.524 m)   Wt 153 lb (69.4 kg)   LMP 10/10/2016 (Exact Date)   SpO2 98%   BMI 29.88 kg/m  Notable VS details: reviewed  Fetal Assessment: FHT:  FHR: 145 bpm, variability: moderate,  accelerations:  Abscent,  decelerations:  Present late decels. Intrauterine resuscitation with position changes, Pitocin continues at 53mu/min. FSE applied, + accel with moderate variability.  Category/reactivity:  Cat II with resolution of late decels after position changed.  UC:   regular, every 2-4 minutes SVE:   4/60/-2; soft/posterior Membrane status: SROM Amniotic color: clear with scant bloody show.   Labs: Lab Results  Component Value Date   WBC 19.5 (H) 07/16/2017   HGB 12.2 07/16/2017   HCT 36.2 07/16/2017   MCV 82.4 07/16/2017   PLT 310 07/16/2017    Assessment / Plan: G3P0020 27 y.o. female at [redacted]w[redacted]d in early labor with Cat II tracing, augmentation for SROM.   Labor: early labor, adequate UC pattern, Cat II tracing s/p epidural placement. FSE applied. Dr Ouida Sills updated.  Fetal Wellbeing:  Category II Pain Control:  Epidural I/D:  Vancomycin 1gm q12h for GBS prophy   McVey, REBECCA A, CNM 07/16/2017, 1:08 PM

## 2017-07-17 ENCOUNTER — Encounter: Payer: Self-pay | Admitting: Obstetrics and Gynecology

## 2017-07-17 LAB — TYPE AND SCREEN
ABO/RH(D): O NEG
ANTIBODY SCREEN: POSITIVE
Unit division: 0
Unit division: 0

## 2017-07-17 LAB — BPAM RBC
Blood Product Expiration Date: 201905282359
Blood Product Expiration Date: 201905282359
Unit Type and Rh: 9500
Unit Type and Rh: 9500

## 2017-07-17 LAB — CBC
HEMATOCRIT: 30.4 % — AB (ref 35.0–47.0)
Hemoglobin: 10.3 g/dL — ABNORMAL LOW (ref 12.0–16.0)
MCH: 27.9 pg (ref 26.0–34.0)
MCHC: 33.8 g/dL (ref 32.0–36.0)
MCV: 82.6 fL (ref 80.0–100.0)
PLATELETS: 243 10*3/uL (ref 150–440)
RBC: 3.68 MIL/uL — ABNORMAL LOW (ref 3.80–5.20)
RDW: 14 % (ref 11.5–14.5)
WBC: 22.7 10*3/uL — AB (ref 3.6–11.0)

## 2017-07-17 LAB — RPR: RPR Ser Ql: NONREACTIVE

## 2017-07-17 NOTE — Progress Notes (Signed)
Subjective: Postpartum Day 1: Cesarean Delivery Patient reports No pain   Objective: Vital signs in last 24 hours: Temp:  [98.2 F (36.8 C)-98.9 F (37.2 C)] 98.6 F (37 C) (04/26 0442) Pulse Rate:  [74-91] 79 (04/26 0850) Resp:  [18-24] 18 (04/26 0850) BP: (89-122)/(53-71) 116/69 (04/26 0850) SpO2:  [95 %-100 %] 98 % (04/26 0850)  Physical Exam:  General: alert and cooperative Lochia: appropriate Uterine Fundus: firm Incision: small drainage  DVT Evaluation: No evidence of DVT seen on physical exam.  Recent Labs    07/16/17 0804 07/17/17 0741  HGB 12.2 10.3*  HCT 36.2 30.4*    Assessment/Plan: Status post Cesarean section. Doing well postoperatively.  Continue current care.  Gwen Her Jimmi Sidener 07/17/2017, 8:55 AM

## 2017-07-17 NOTE — Anesthesia Post-op Follow-up Note (Signed)
  Anesthesia Pain Follow-up Note  Patient: Jamie [name]  Day #: 1  Date of Follow-up: 07/17/2017 Time: 8:07 AM  Last Vitals:  Vitals:   07/17/17 0008 07/17/17 0442  BP: (!) 107/57 (!) 102/53  Pulse: 77 74  Resp: 18 18  Temp: 37.2 C 37 C  SpO2: 98% 98%    Level of Consciousness: alert  Pain: none   Side Effects:None  Catheter Site Exam:clean, dry, intact     Plan: D/C from anesthesia care at surgeon's request  Hedda Slade

## 2017-07-17 NOTE — Anesthesia Postprocedure Evaluation (Signed)
Anesthesia Post Note  Patient: Jamie [name]  Procedure(s) Performed: CESAREAN SECTION (N/A Abdomen)  Patient location during evaluation: Mother Baby Anesthesia Type: Epidural Level of consciousness: awake, awake and alert and oriented Pain management: pain level controlled Vital Signs Assessment: post-procedure vital signs reviewed and stable Respiratory status: spontaneous breathing Cardiovascular status: blood pressure returned to baseline Postop Assessment: no backache, no headache, no apparent nausea or vomiting and adequate PO intake Anesthetic complications: no     Last Vitals:  Vitals:   07/17/17 0008 07/17/17 0442  BP: (!) 107/57 (!) 102/53  Pulse: 77 74  Resp: 18 18  Temp: 37.2 C 37 C  SpO2: 98% 98%    Last Pain:  Vitals:   07/17/17 0615  TempSrc:   PainSc: 0-No pain                 Gamble Enderle Lorenza Chick

## 2017-07-17 NOTE — Lactation Note (Signed)
This note was copied from a baby's chart. Lactation Consultation Note  Patient Name: Jamie [name] Date: 07/17/2017 Reason for consult: Follow-up assessment;Primapara   Maternal Data Formula Feeding for Exclusion: No Has patient been taught Hand Expression?: Yes Does the patient have breastfeeding experience prior to this delivery?: No  Feeding Feeding Type: Breast Fed Length of feed: 15 min  LATCH Score Latch: Grasps breast easily, tongue down, lips flanged, rhythmical sucking.  Audible Swallowing: A few with stimulation  Type of Nipple: Everted at rest and after stimulation  Comfort (Breast/Nipple): Filling, red/small blisters or bruises, mild/mod discomfort  Hold (Positioning): Assistance needed to correctly position infant at breast and maintain latch.  LATCH Score: 7  Interventions Interventions: Assisted with latch;Hand express;Adjust position;Support pillows;Position options;Coconut oil  Lactation Tools Discussed/Used Tools: (nipple shield not needed ) WIC Program: No   Consult Status Consult Status: PRN    Ferol Luz 07/17/2017, 3:28 PM

## 2017-07-18 LAB — CBC
HEMATOCRIT: 27.7 % — AB (ref 35.0–47.0)
Hemoglobin: 9.4 g/dL — ABNORMAL LOW (ref 12.0–16.0)
MCH: 27.9 pg (ref 26.0–34.0)
MCHC: 34 g/dL (ref 32.0–36.0)
MCV: 82.1 fL (ref 80.0–100.0)
Platelets: 273 10*3/uL (ref 150–440)
RBC: 3.38 MIL/uL — ABNORMAL LOW (ref 3.80–5.20)
RDW: 14.2 % (ref 11.5–14.5)
WBC: 17.1 10*3/uL — ABNORMAL HIGH (ref 3.6–11.0)

## 2017-07-18 MED ORDER — IBUPROFEN 600 MG PO TABS: 600.0000 mg | ORAL_TABLET | Freq: Four times a day (QID) | ORAL | 0 refills | Status: DC | PRN

## 2017-07-18 MED ORDER — ACETAMINOPHEN 325 MG PO TABS: 650.0000 mg | ORAL_TABLET | ORAL | Status: DC | PRN

## 2017-07-18 MED ORDER — MEASLES, MUMPS & RUBELLA VAC ~~LOC~~ INJ
0.5000 mL | INJECTION | Freq: Once | SUBCUTANEOUS | Status: AC
  Administered 2017-07-18: 0.5 mL via SUBCUTANEOUS
  Filled 2017-07-18 (×2): qty 0.5

## 2017-07-18 MED ORDER — OXYCODONE HCL 5 MG PO TABS: 5.0000 mg | ORAL_TABLET | ORAL | 0 refills | Status: DC | PRN

## 2017-07-18 MED ORDER — FERROUS SULFATE 325 (65 FE) MG PO TABS
325.0000 mg | ORAL_TABLET | Freq: Two times a day (BID) | ORAL | Status: DC
  Administered 2017-07-18: 325 mg via ORAL
  Filled 2017-07-18: qty 1

## 2017-07-18 MED ORDER — FERROUS SULFATE 325 (65 FE) MG PO TABS: 325.0000 mg | ORAL_TABLET | Freq: Two times a day (BID) | ORAL | Status: DC

## 2017-07-18 MED ORDER — SENNOSIDES-DOCUSATE SODIUM 8.6-50 MG PO TABS: 2.0000 | ORAL_TABLET | Freq: Every day | ORAL | Status: DC | PRN

## 2017-07-18 MED ORDER — IBUPROFEN 600 MG PO TABS: 600.0000 mg | ORAL_TABLET | Freq: Four times a day (QID) | ORAL | 0 refills | Status: DC

## 2017-07-18 NOTE — Discharge Instructions (Signed)
After Your Delivery Discharge Instructions   Postpartum: Care Instructions  After childbirth (postpartum period), your body goes through many changes. Some of these changes happen over several weeks. In the hours after delivery, your body will begin to recover from childbirth while it prepares to breastfeed your newborn. You may feel emotional during this time. Your hormones can shift your mood without warning for no clear reason.  In the first couple of weeks after childbirth, many women have emotions that change from happy to sad. You may find it hard to sleep. You may cry a lot. This is called the "baby blues." These overwhelming emotions often go away within a couple of days or weeks. But it's important to discuss your feelings with your doctor.  You should call your care provider if you have unrelieved feelings of:  Inability to cope  Sadness  Anxiety  Lack of interest in baby  Insomnia  Crying  It is easy to get too tired and overwhelmed during the first weeks after childbirth. Don't try to do too much. Get rest whenever you can, accept help from others, and eat well and drink plenty of fluids.  About 4 to 6 weeks after your baby's birth, you will have a follow-up visit with your care provider. This visit is your time to talk to your provider about anything you are concerned or curious about.  Follow-up care is a key part of your treatment and safety. Be sure to make and go to all appointments, and call your doctor if you are having problems. It's also a good idea to know your test results and keep a list of the medicines you take.  How can you care for yourself at home?  Sleep or rest when your baby sleeps.  Get help with household chores from family or friends, if you can. Do not try to do it all yourself.  If you have hemorrhoids or swelling or pain around the opening of your vagina, try using cold and heat. You can put ice or a cold pack on the area for 10 to 20 minutes at  a time. Put a thin cloth between the ice and your skin. Also try sitting in a few inches of warm water (sitz bath) 3 times a day and after bowel movements.  Take pain medicines exactly as directed.  If the provider gave you a prescription medicine for pain, take it as prescribed.  If you do not have a prescription and need something over the counter, you can take:  Ibuprofen (Motrin, Advil) up to '600mg'$  every 6 hours as needed for pain  Acetaminophen (Tylenol) up to '650mg'$  every 4 hours as needed for pain  Some people find it helpful to alternate between these two medications.   No driving for 1-2 weeks or while taking pain medications.   Eat more fiber to avoid constipation. Include foods such as whole-grain breads and cereals, raw vegetables, raw and dried fruits, and beans.  Drink plenty of fluids, enough so that your urine is light yellow or clear like water. If you have kidney, heart, or liver disease and have to limit fluids, talk with your doctor before you increase the amount of fluids you drink.  Do not put anything in the vagina for 6 weeks. This means no sex, no tampons, no douching, and no enemas.  If you have stitches, keep the area clean by pouring or spraying warm water over the area outside your vagina and anus after you use the toilet.  No strenuous activity or heavy lifting for 6 weeks  °· No tub baths; showers only °· Continue prenatal vitamin and iron. °· If breastfeeding: °· Increase calories and fluids while breastfeeding. °· You may have a slight fever when your milk comes in, but it should go away on its own. If it does not, and rises above 101.0 please call the doctor. °· For breastfeeding concerns, the lactation consultant can be reached at [phone]. °· For concerns about your baby, please call your pediatrician. ° °· Keep a list of questions to bring to your postpartum visit. Your questions might be about: °· Changes in your breasts, such as lumps or  soreness. °· When to expect your menstrual period to start again. °· What form of birth control is best for you. °· Weight you have put on during the pregnancy. °· Exercise options. °· What foods and drinks are best for you, especially if you are breastfeeding. °· Problems you might be having with breastfeeding. °· When you can have sex. Some women may want to talk about lubricants for the vagina. °· Any feelings of sadness or restlessness that you are having. ° ° °When should you call for help? ° °Call 911 anytime you think you may need emergency care. For example, call if: °· You have thoughts of harming yourself, your baby, or another person. °· You passed out (lost consciousness). ° °Call the office at [phone] or seek immediate medical care if: °· If you have heavy bleeding such that you are soaking 1 pad in an hour for 2 hours °· You are dizzy or lightheaded, or you feel like you may faint. °· You have a fever; a temperature of 101.0 F or greater °· Chills °· Difficulty urinating °· Headache unrelieved by "pain meds"  °· Visual changes °· Pain in the right side of your belly near your ribs °· Breasts reddened, hard, hot to the touch or any other breast concerns °· Nipple discharge which is foul-smelling or contains pus  °· Increased pain at the site of the surgical incision  °· Incision drainage or problems °· New pain unrelieved with recommended over-the-counter dosages °· Difficulty breathing with or without chest pain  °· New leg pain, swelling, or redness, especially if it is only on one leg °· Any other concerns ° °Watch closely for changes in your health, and be sure to contact your provider if: °· You have new or worse vaginal discharge. °· You feel sad or depressed. °· You are having problems with your breasts or breastfeeding. ° ° ° °

## 2017-07-18 NOTE — Progress Notes (Signed)
Mom doing well today. Vitals stable and WDL. Pain in back and abdomen around 5-6/10 this morning. Pain controlled on oral pain meds. Fundus firm at U/1. Bleeding small. Incision dressing taken off by CNM. Incision approximated, no drainage. Patient voiding. Patient walked in the hall this morning. No assistance, tolerated well. Patient breastfeeding with some formula supplementation. Patient states breastfeeds are going well. Patient awaiting discharge. Patient to follow up in two weeks, per Joycelyn Schmid, CNM.

## 2017-07-18 NOTE — Progress Notes (Signed)
Discharge instructions given. Patient verbalizes understanding of teaching. Patient discharged home via wheelchair at 1350. 

## 2017-07-20 NOTE — Op Note (Signed)
[name], MOW               ACCOUNT NO.:  [id number]  MEDICAL RECORD NO.:  [id number]  LOCATION:                                 FACILITY:  PHYSICIAN:  Laverta Baltimore, MD     DATE OF BIRTH:  DATE OF PROCEDURE:  07/16/2017 DATE OF DISCHARGE:                              OPERATIVE REPORT   PREOPERATIVE DIAGNOSIS: 1. A 39+ 6 weeks estimated gestational age. 2. Fetal intolerance to labor.  POSTOPERATIVE DIAGNOSIS: 1. A 39+ 6 weeks estimated gestational age. 2. Fetal intolerance to labor.  PROCEDURE PERFORMED:  Primary low-transverse cesarean section.  SURGEON:  Laverta Baltimore, MD  ANESTHESIA:  Surgical dosing of continuous lumbar epidural.  FIRST ASSISTANT:  Hassan Buckler, certified nurse midwife.  INDICATION:  A 27 year old gravida 2, para 0 patient, 39+ 6 weeks, was admitted in active labor.  The patient was on Pitocin and did not progress past 4 cm and demonstrated fetal deceleration and decreased variability.  DESCRIPTION OF PROCEDURE:  After adequate surgical dosing of continuous lumbar epidural, the patient was placed in dorsal supine position, hip rolled on the right side.  The patient was prepped and draped in normal sterile fashion.  The patient did receive clindamycin, gentamicin, and Zithromax for surgical prophylaxis prior to commencement of the case. Time-out was performed.  A Pfannenstiel incision was made 2 fingerbreadths above the symphysis pubis.  Sharp dissection was used to identify the fascia.  Fascia was opened in the midline and opened in a transverse fashion.  The superior aspect of the fascia was grasped with Kocher clamps and the recti muscles were dissected free.  Inferior aspect of the fascia was grasped with Kocher clamps and the pyramidalis muscle was dissected free.  Entry into the peritoneal cavity was accomplished sharply.  The vesicular uterine peritoneal fold was identified and a bladder flap was created and the bladder was  reflected inferiorly.  A low transverse uterine incision was made upon entry into the endometrial cavity, clear fluid resulted.  Fluid was scant.  The incision was extended with blunt transverse traction.  A wedged fetal head with caput was then brought to the incision and with fundal pressure, the head was delivered.  A loose nuchal cord was then reduced followed by delivery of shoulders and the body.  A vigorous female was born at 29 on July 16, 2017.  Delayed cord clamping ensued for the next 60 seconds while drying the baby.  The infant was then passed to neonatologist who assigned Apgar scores of 9 and 9, weight 7 pounds 7 ounces.  Placenta was manually delivered and the uterus was exteriorized.  Endometrial cavity was wiped clean with laparotomy tape. The uterus was slightly boggy and atonic and despite intravenous Pitocin, the tone did not improve immediately.  A 0.2 mg of intramuscular methargen was then administered and uterine tone improved. The uterine incision was closed with a running 1 chromic suture in a running locking fashion.  Two additional figure-of-eight sutures were required.  The fallopian tubes and ovaries appeared normal.  The posterior cul-de-sac was irrigated and suctioned.  The uterus was placed back into the abdominal cavity and the pericolic gutters were wiped clean with  laparotomy tape.  The uterine incision again appeared hemostatic.  The incision was then covered with Interceed in a T-shaped fashion.  The fascia was then closed in non-running 0 Vicryl suture. Good approximation of edges.  Two additional figure-of-eight sutures were used centrally to close a small defect that was showing some intraperitoneal fluid oozing from within.  The fascial edges were then injected with a solution of 20 mL of 1.3% Exparel plus 50 mL of 0.5% Marcaine plus 50 mL normal saline.  60 mL was used to inject the fascial edges.  The subcutaneous tissues were then irrigated  and bovied for hemostasis and the skin was reapproximated with Insorb absorbable staples and an additional 25 mL of Exparel solution was injected subcuticularly.  There were no complications.  ESTIMATED BLOOD LOSS:  500 mL.  INTRAOPERATIVE FLUIDS:  1200 mL.  URINE OUTPUT:  750 mL all of which was not drained prior to commencement of the case.  The patient tolerated the procedure well and was taken to recovery room in good condition.          ______________________________ Laverta Baltimore, MD     TS/MEDQ  D:  07/16/2017  T:  07/17/2017  Job:  [id number]

## 2017-11-03 IMAGING — CT CT ABD-PEL WO/W CM
2 of 8 series · 14 of 46 positions shown, 16 images · IV contrast (APPLIED)
Comparison: None.

CLINICAL DATA: Microscopic hematuria x5 months, right flank pain

EXAM:
CT ABDOMEN AND PELVIS WITHOUT AND WITH CONTRAST
TECHNIQUE: Multidetector CT imaging of the abdomen and pelvis was performed
following the standard protocol before and following the bolus
administration of intravenous contrast.
CONTRAST:  125mL 8CBWAO-REE IOPAMIDOL (8CBWAO-REE) INJECTION 61%

[Series 3: coronal pre · coronal · non-contrast · 0.59mm/px · 3 of 66 slices shown]
[im 17/66  soft-tissue]
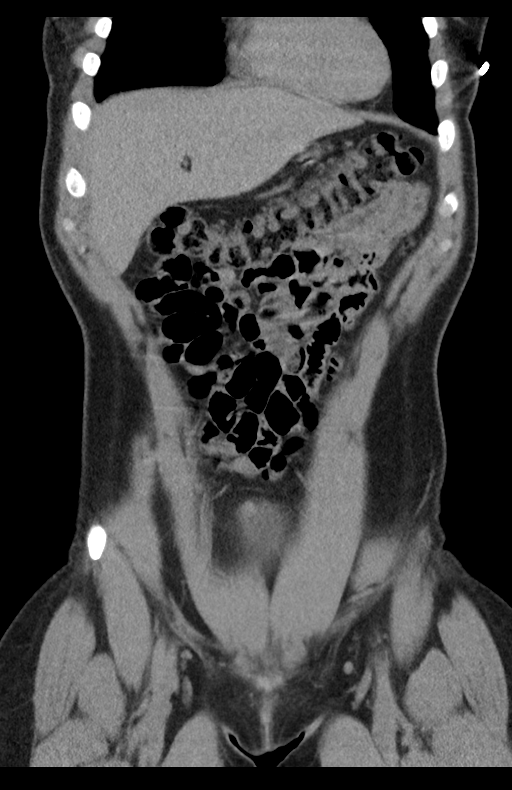
[im 33/66  soft-tissue]
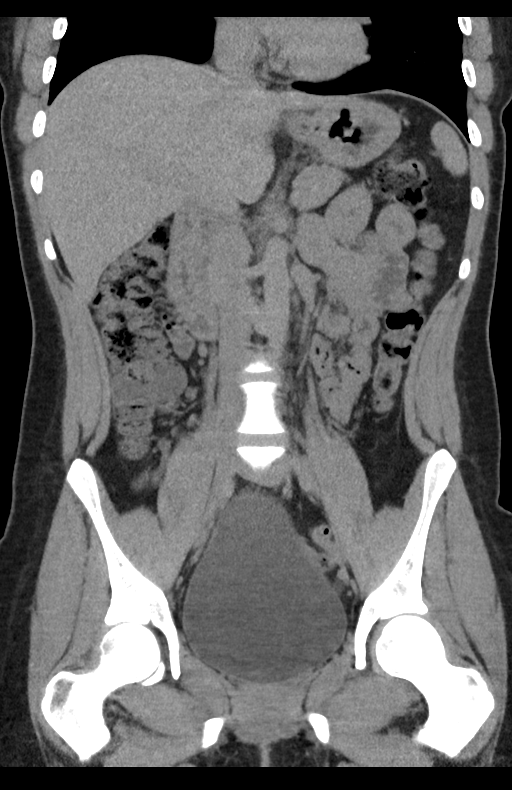
[im 49/66  soft-tissue]
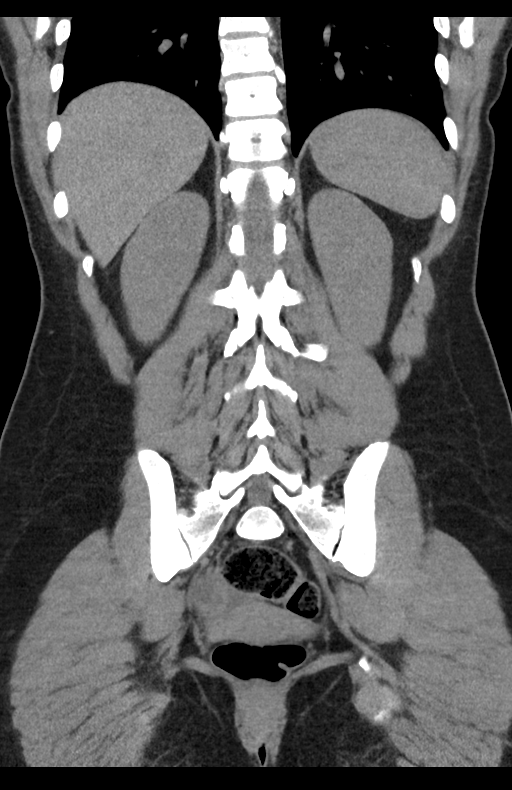

[Series 8: axial delay · axial · delayed · 0.66mm/px · z∈[-929,-529]mm · 11 of 96 slices shown, 13 images]
[im 8/96  soft-tissue]
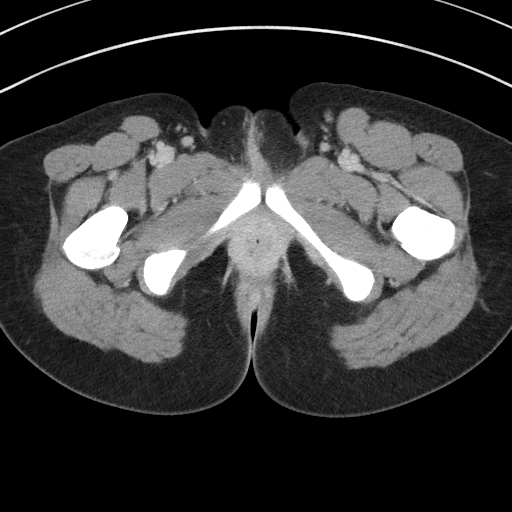
[im 8/96  bone]
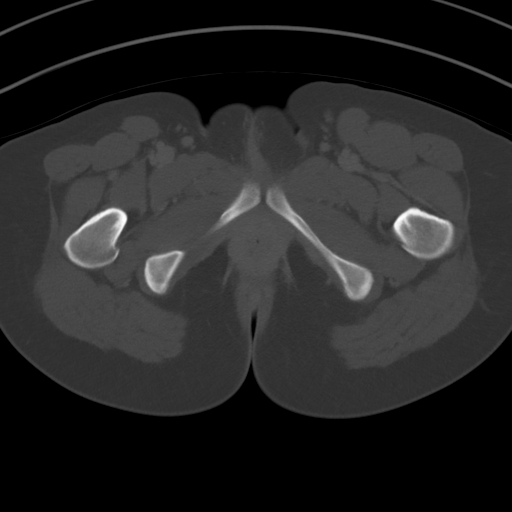
[im 16/96  soft-tissue]
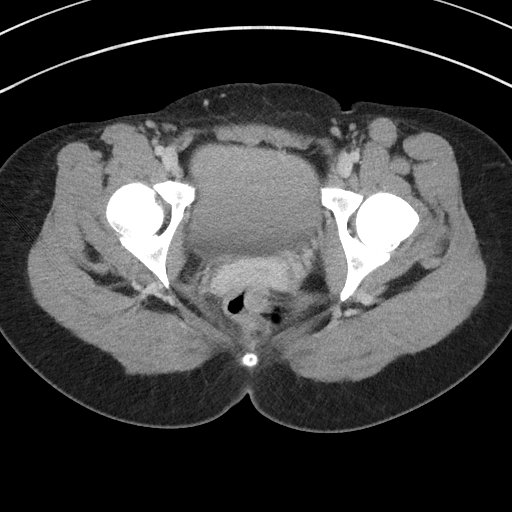
[im 24/96  soft-tissue]
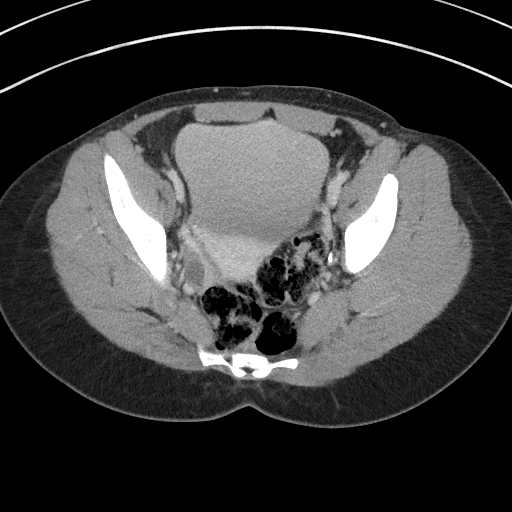
[im 32/96  soft-tissue]
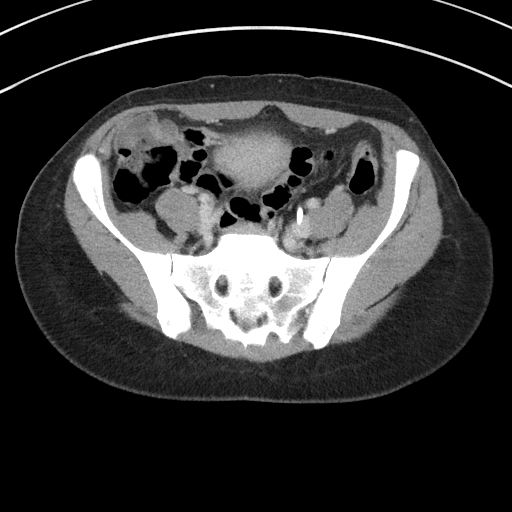
[im 40/96  soft-tissue]
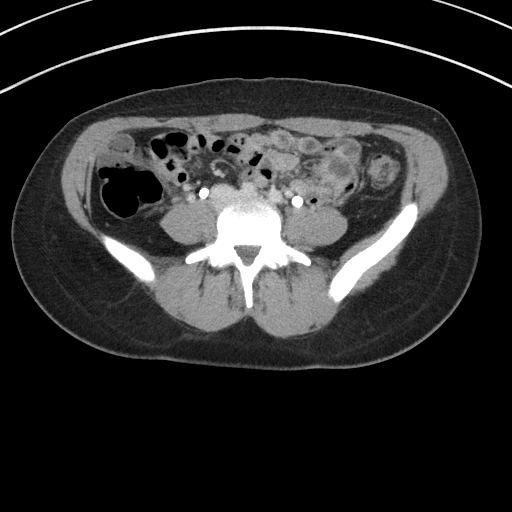
[im 48/96  soft-tissue]
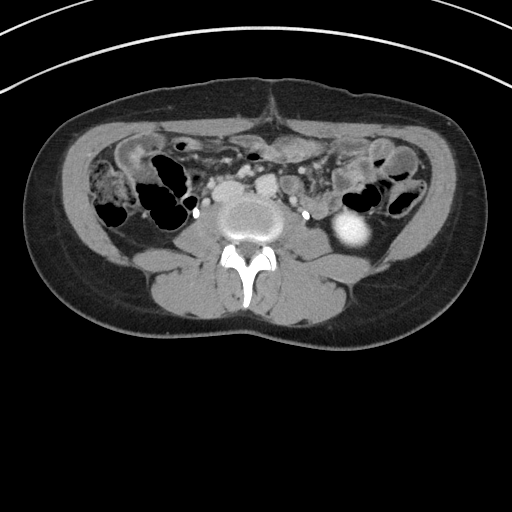
[im 56/96  soft-tissue]
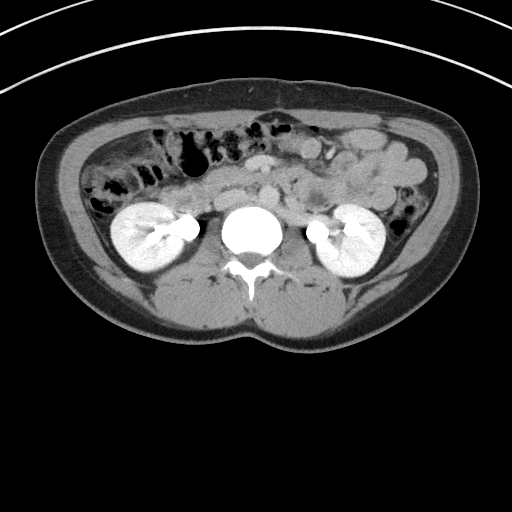
[im 64/96  soft-tissue]
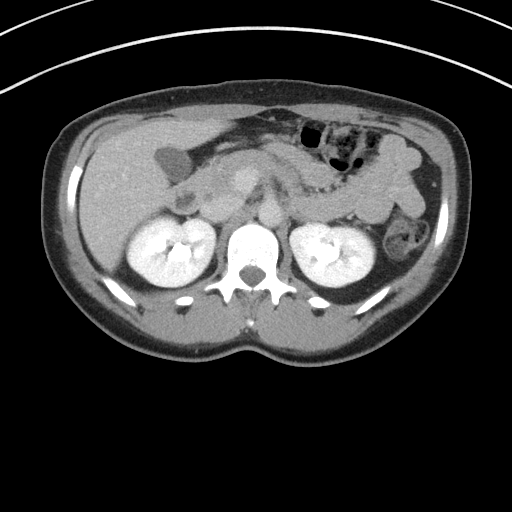
[im 72/96  soft-tissue]
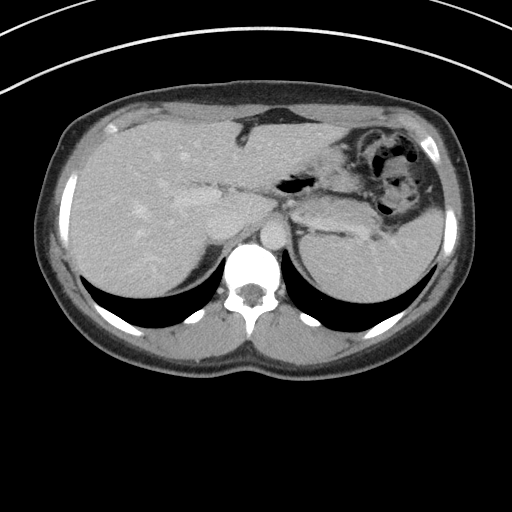
[im 72/96  bone]
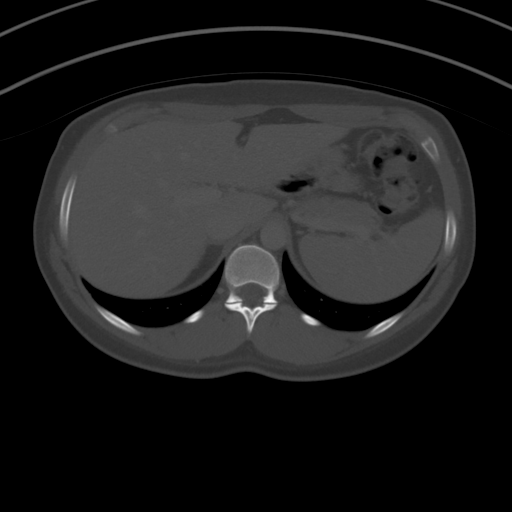
[im 80/96  soft-tissue]
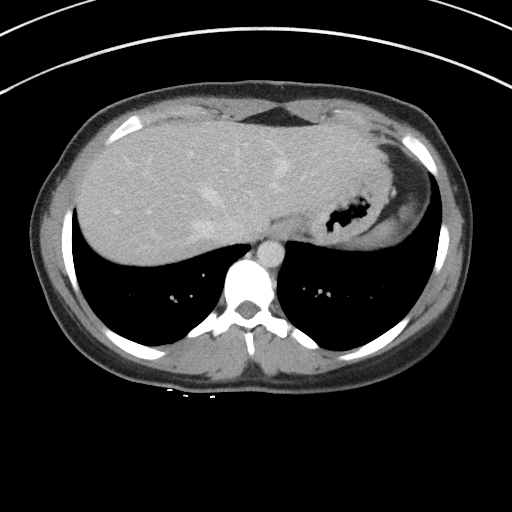
[im 88/96  soft-tissue]
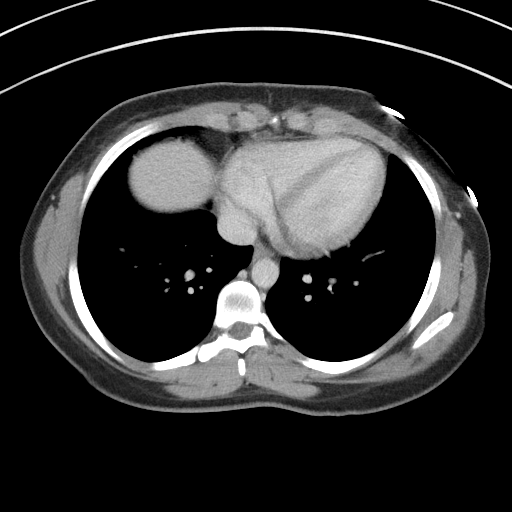

[14 of 46 positions shown; findings below may reference images not displayed]

FINDINGS: Lower chest: Lung bases are clear.

Hepatobiliary: Liver is within normal limits. No
suspicious/enhancing hepatic lesions.

Gallbladder is unremarkable. No intrahepatic or extrahepatic ductal
dilatation.

Pancreas: Within normal limits.

Spleen: Within normal limits.

Adrenals/Urinary Tract: Adrenal glands are within normal limits.

Kidneys are within normal limits.  No enhancing renal lesions.

2 mm nonobstructing right upper pole renal calculus (series 2/ image
32). No ureteral or bladder calculi. No hydronephrosis.

On delayed imaging, there are no filling defects in the bilateral
opacified proximal collecting systems, ureters, or bladder.
Mid/distal right ureter remains unopacified but nondilated.

Bladder is within normal limits.

Stomach/Bowel: Stomach is within normal limits.

No evidence of bowel obstruction.

Normal appendix (series 8/ image 63).

Vascular/Lymphatic: No evidence of abdominal aortic aneurysm.

No suspicious abdominopelvic lymphadenopathy.

Reproductive: Uterus is within normal limits.

Right ovary is within normal limits.

No left adnexal mass.

Other: No abdominopelvic ascites.

Musculoskeletal: Visualized osseous structures are within normal
limits.
IMPRESSION: 2 mm nonobstructing right upper pole renal calculus. No ureteral or
bladder calculi. No hydronephrosis.

No enhancing renal lesions.

## 2019-06-17 ENCOUNTER — Ambulatory Visit: Payer: Self-pay | Attending: Internal Medicine

## 2019-06-17 DIAGNOSIS — Z23 Encounter for immunization: Secondary | ICD-10-CM

## 2019-06-17 NOTE — Progress Notes (Signed)
   Covid-19 Vaccination Clinic  Name:  Jamie [name]    MRN: [id number] DOB: 22-Dec-1990  06/17/2019  [name] was observed post Covid-19 immunization for 15 minutes without incident. She was provided with Vaccine Information Sheet and instruction to access the V-Safe system.   [name] was instructed to call 911 with any severe reactions post vaccine: Marland Kitchen Difficulty breathing  . Swelling of face and throat  . A fast heartbeat  . A bad rash all over body  . Dizziness and weakness   Immunizations Administered    Name Date Dose VIS Date Route   Pfizer COVID-19 Vaccine 06/17/2019  1:39 PM 0.3 mL 03/04/2019 Intramuscular   Manufacturer: [place]   Lot: [id number]   [place]: [id number]

## 2019-07-08 ENCOUNTER — Ambulatory Visit: Payer: Self-pay | Attending: Internal Medicine

## 2019-07-08 DIAGNOSIS — Z23 Encounter for immunization: Secondary | ICD-10-CM

## 2019-07-08 NOTE — Progress Notes (Signed)
   Covid-19 Vaccination Clinic  Name:  BRYCE FEVER    MRN: [id number] DOB: 10-19-1990  07/08/2019  Ms. Montag was observed post Covid-19 immunization for 15 minutes without incident. She was provided with Vaccine Information Sheet and instruction to access the V-Safe system.   Ms. Wilderman was instructed to call 911 with any severe reactions post vaccine: Marland Kitchen Difficulty breathing  . Swelling of face and throat  . A fast heartbeat  . A bad rash all over body  . Dizziness and weakness   Immunizations Administered    Name Date Dose VIS Date Route   Pfizer COVID-19 Vaccine 07/08/2019  1:10 PM 0.3 mL 03/04/2019 Intramuscular   Manufacturer: [place]   Lot: [id number]   [place]: [id number]

## 2021-12-11 ENCOUNTER — Other Ambulatory Visit: Payer: Self-pay | Admitting: Orthopedic Surgery

## 2021-12-11 ENCOUNTER — Encounter: Payer: Self-pay | Admitting: Orthopedic Surgery

## 2021-12-12 NOTE — Anesthesia Preprocedure Evaluation (Addendum)
Anesthesia Evaluation  Patient identified by MRN, date of birth, ID band Patient awake    Reviewed: Allergy & Precautions, NPO status , Patient's Chart, lab work & pertinent test results  Airway Mallampati: III  TM Distance: >3 FB Neck ROM: full    Dental  (+) Chipped   Pulmonary neg pulmonary ROS,    Pulmonary exam normal        Cardiovascular negative cardio ROS Normal cardiovascular exam     Neuro/Psych negative neurological ROS  negative psych ROS   GI/Hepatic negative GI ROS, Neg liver ROS,   Endo/Other  negative endocrine ROS  Renal/GU negative Renal ROS  negative genitourinary   Musculoskeletal   Abdominal   Peds  Hematology negative hematology ROS (+)   Anesthesia Other Findings Past Medical History: No date: Eczema No date: Migraine     Comment:  none in last 6 months No date: Ovarian cyst 01/22/2014: Ovarian cyst     Comment:  left side No date: Wears contact lenses  Past Surgical History: 07/16/2017: CESAREAN SECTION; N/A     Comment:  Procedure: CESAREAN SECTION;  Surgeon: Boykin Nearing, MD;  Location: [facility] ORS;  Service: Obstetrics;               Laterality: N/A;  Girl born @ 44 Apgars: 9/9 Weight:               7lbs 7 oz 01/2014: DILATION AND CURETTAGE OF UTERUS 01/29/2015: LAPAROSCOPIC OVARIAN CYSTECTOMY; Left     Comment:  Procedure: LAPAROSCOPIC OVARIAN CYSTECTOMY;  Surgeon:               Rubie Maid, MD;  Location: [facility] ORS;  Service:               Gynecology;  Laterality: Left; 2009: WISDOM TOOTH EXTRACTION; Bilateral     Comment:  all 4 teeth  BMI    Body Mass Index: 28.51 kg/m      Reproductive/Obstetrics negative OB ROS                             Anesthesia Physical Anesthesia Plan  ASA: 2  Anesthesia Plan: General   Post-op Pain Management: Tylenol PO (pre-op) and Celebrex PO (pre-op)   Induction:  Intravenous  PONV Risk Score and Plan: 3 and Ondansetron, Dexamethasone and Midazolam  Airway Management Planned: LMA  Additional Equipment:   Intra-op Plan:   Post-operative Plan: Extubation in OR  Informed Consent: I have reviewed the patients History and Physical, chart, labs and discussed the procedure including the risks, benefits and alternatives for the proposed anesthesia with the patient or authorized representative who has indicated his/her understanding and acceptance.     Dental Advisory Given  Plan Discussed with: Anesthesiologist, CRNA and Surgeon  Anesthesia Plan Comments:       Anesthesia Quick Evaluation

## 2021-12-12 NOTE — H&P (Signed)
Chief Complaint: Chief Complaint  Patient presents with  Fracture  Left hand fracture was seen at [facility]   Jamie [name] is a 31 y.o. female who presents today for evaluation of left hand fracture. 6 days ago patient was playing kickball, jammed her left fifth digit and suffered a fracture to the left fifth metacarpal neck extending into the shaft. Patient had some slight angulation rotational deformity and was seen by hand specialist, they discussed surgical intervention but elected not to proceed with surgery. Patient is right-hand dominant. She comes in today for recheck. She has been wearing a removable custom molded splint for her left fifth metacarpal fracture. She has been taking occasional tramadol and over-the-counter anti-inflammatory medications for pain. Pain has been moderate. No numbness or tingling. Today with removal of splint and check and rotation she states rotational deformity is greater than what it was at last orthopedic visit.  Past Medical History: Past Medical History:  Diagnosis Date  Allergic rhinitis  Chickenpox  Migraine headache   Past Surgical History: Past Surgical History:  Procedure Laterality Date  PELVIC LAPAROSCOPY 01/29/2015  dr Earl Gala of left dermoid  CESAREAN SECTION 07/16/2017  DILATION AND CURETTAGE OF UTERUS  EXTRACTION TEETH  wisdom  IUD insertion  LAPAROSCOPIC OOPHORECTOMY Left   Past Family History: Family History  Problem Relation Age of Onset  No Known Problems Mother  Anxiety Father  High blood pressure (Hypertension) Father  Parkinsonism Father  PDD Father  No Known Problems Brother  Dementia Paternal Grandmother  Cirrhosis Paternal Grandmother   Medications: Current Outpatient Medications Ordered in Epic  Medication Sig Dispense Refill  traMADoL (ULTRAM) 50 mg tablet Take 1 tablet every 6 hours by oral route as needed.  butalbital-acetaminophen-caffeine (FIORICET) 50-325-40 mg tablet Take 1 tablet by mouth  every 6 (six) hours as needed for Pain 30 tablet 0  clobetasoL (TEMOVATE) 0.05 % ointment Apply topically once daily as needed  ergocalciferol, vitamin D2, 1,250 mcg (50,000 unit) capsule Take 1 capsule (50,000 Units total) by mouth once a week for 30 days 12 capsule 1  ibuprofen (MOTRIN) 600 MG tablet Take 1 tablet by mouth every 6 (six) hours as needed  levonorgestrel (MIRENA 52 MG) 20 mcg/24 hr (5 years) IUD Insert 1 each into the uterus once Follow package directions.   Current Facility-Administered Medications Ordered in Epic  Medication Dose Route Frequency Provider Last Rate Last Admin  cyanocobalamin (VITAMIN B12) injection 1,000 mcg 1,000 mcg Intramuscular Q30 Days Gladstone Lighter, MD 1,000 mcg at 08/09/21 0940   Allergies: Allergies  Allergen Reactions  Amoxicillin Hives  Penicillin G Hives  Penicillins Hives    Review of Systems:  A comprehensive 14 point ROS was performed, reviewed by me today, and the pertinent orthopaedic findings are documented in the HPI.  Exam: BP 120/86  Ht 152.4 cm (5')  Wt 66.2 kg (146 lb)  BMI 28.51 kg/m  General:  Well developed, well nourished, no apparent distress, normal affect, normal gait with no antalgic component.   HEENT: Head normocephalic, atraumatic, PERRL.   Abdomen: Soft, non tender, non distended, Bowel sounds present.  Heart: Examination of the heart reveals regular, rate, and rhythm. There is no murmur noted on ascultation. There is a normal apical pulse.  Lungs: Lungs are clear to auscultation. There is no wheeze, rhonchi, or crackles. There is normal expansion of bilateral chest walls.   Left hand: Examination of the left hand shows swelling and bruising along the ulnar aspect of the hand along the  fifth metacarpal. No significant swelling at the MCP PIP or DIP joint. She is able to maintain full active extension. No tenderness to the carpals, scaphoid remainder of the metacarpals. With flexion of the second through  the fifth digit there is rotational deformity, the fifth digit is overlying the fourth digit significantly.  AP lateral and oblique views of the left hand are ordered interpreted by me in the office today. Impression: Patient has a displaced fifth metacarpal neck fracture, slightly spiraled oblique extending into the distal metacarpal shaft. There is no intra-articular component. No other evidence of acute bony abnormality.  Impression: Closed displaced fracture of shaft of fifth metacarpal bone of left hand, initial encounter [S62.327A] Closed displaced fracture of shaft of fifth metacarpal bone of left hand, initial encounter (primary encounter diagnosis)  Plan:  33. 31 year old female with displaced left fifth neck metacarpal fracture. She has a moderate rotational deformity of the left fifth digit overlying the fourth digit. Risks, benefits, complications of left fifth digit metacarpal closed reduction and pinning have been discussed with the patient. Patient has agreed and consent the procedure with Dr. Hessie Knows. We will try to perform this procedure on Friday, 12/13/2021. Patient will follow-up several days later for pin check, resplinting. Pins will need to be removed approximately 2 weeks postop.  This note was generated in part with voice recognition software and I apologize for any typographical errors that were not detected and corrected.  Feliberto Gottron MPA-C   Electronically signed by Feliberto Gottron, [place] at 12/11/2021 1:11 PM EDT  Reviewed  H+P. No changes noted.

## 2021-12-13 ENCOUNTER — Ambulatory Visit: Payer: Managed Care, Other (non HMO) | Admitting: Anesthesiology

## 2021-12-13 ENCOUNTER — Ambulatory Visit
Admission: RE | Admit: 2021-12-13 | Discharge: 2021-12-13 | Disposition: A | Discharge status: Home / Self Care | Payer: Managed Care, Other (non HMO) | Attending: Orthopedic Surgery | Admitting: Orthopedic Surgery

## 2021-12-13 ENCOUNTER — Encounter
Admission: RE | Disposition: A | Discharge status: Home / Self Care | Payer: Self-pay | Source: Home / Self Care | Attending: Orthopedic Surgery

## 2021-12-13 ENCOUNTER — Other Ambulatory Visit: Payer: Self-pay

## 2021-12-13 ENCOUNTER — Ambulatory Visit: Payer: Self-pay

## 2021-12-13 ENCOUNTER — Encounter: Payer: Self-pay | Admitting: Orthopedic Surgery

## 2021-12-13 ENCOUNTER — Ambulatory Visit (AMBULATORY_SURGERY_CENTER): Payer: Managed Care, Other (non HMO) | Admitting: Anesthesiology

## 2021-12-13 DIAGNOSIS — S62327A Displaced fracture of shaft of fifth metacarpal bone, left hand, initial encounter for closed fracture: Secondary | ICD-10-CM | POA: Diagnosis present

## 2021-12-13 DIAGNOSIS — Y936A Activity, physical games generally associated with school recess, summer camp and children: Secondary | ICD-10-CM | POA: Insufficient documentation

## 2021-12-13 HISTORY — DX: Dermatitis, unspecified: L30.9

## 2021-12-13 HISTORY — DX: Presence of spectacles and contact lenses: Z97.3

## 2021-12-13 HISTORY — PX: CLOSED REDUCTION METACARPAL WITH PERCUTANEOUS PINNING: SHX5613

## 2021-12-13 LAB — POCT PREGNANCY, URINE: Preg Test, Ur: NEGATIVE

## 2021-12-13 SURGERY — CLOSED REDUCTION, FRACTURE, METACARPAL BONE, WITH PERCUTANEOUS PINNING
Anesthesia: General | Site: Finger | Laterality: Left

## 2021-12-13 MED ORDER — HYDROCODONE-ACETAMINOPHEN 5-325 MG PO TABS: 1.0000 | ORAL_TABLET | ORAL | Status: DC | PRN

## 2021-12-13 MED ORDER — LIDOCAINE HCL (CARDIAC) PF 100 MG/5ML IV SOSY
PREFILLED_SYRINGE | INTRAVENOUS | Status: DC | PRN
  Administered 2021-12-13: 50 mg via INTRATRACHEAL

## 2021-12-13 MED ORDER — CEFAZOLIN SODIUM-DEXTROSE 2-4 GM/100ML-% IV SOLN
2.0000 g | INTRAVENOUS | Status: AC
  Administered 2021-12-13: 2 g via INTRAVENOUS

## 2021-12-13 MED ORDER — IBUPROFEN 400 MG PO TABS
400.0000 mg | ORAL_TABLET | Freq: Once | ORAL | Status: AC
  Administered 2021-12-13: 400 mg via ORAL

## 2021-12-13 MED ORDER — ONDANSETRON HCL 4 MG PO TABS: 4.0000 mg | ORAL_TABLET | Freq: Four times a day (QID) | ORAL | Status: DC | PRN

## 2021-12-13 MED ORDER — DEXAMETHASONE SODIUM PHOSPHATE 4 MG/ML IJ SOLN
INTRAMUSCULAR | Status: DC | PRN
  Administered 2021-12-13: 4 mg via INTRAVENOUS

## 2021-12-13 MED ORDER — FENTANYL CITRATE PF 50 MCG/ML IJ SOSY: 25.0000 ug | PREFILLED_SYRINGE | INTRAMUSCULAR | Status: DC | PRN

## 2021-12-13 MED ORDER — HYDROCODONE-ACETAMINOPHEN 7.5-325 MG PO TABS: 1.0000 | ORAL_TABLET | ORAL | Status: DC | PRN

## 2021-12-13 MED ORDER — ACETAMINOPHEN 10 MG/ML IV SOLN: 1000.0000 mg | Freq: Once | INTRAVENOUS | Status: DC | PRN

## 2021-12-13 MED ORDER — OXYCODONE HCL 5 MG/5ML PO SOLN: 5.0000 mg | Freq: Once | ORAL | Status: AC | PRN

## 2021-12-13 MED ORDER — OXYCODONE HCL 5 MG PO TABS
5.0000 mg | ORAL_TABLET | Freq: Once | ORAL | Status: AC | PRN
  Administered 2021-12-13: 5 mg via ORAL

## 2021-12-13 MED ORDER — SODIUM CHLORIDE 0.9 % IV SOLN: INTRAVENOUS | Status: DC

## 2021-12-13 MED ORDER — FENTANYL CITRATE (PF) 100 MCG/2ML IJ SOLN
INTRAMUSCULAR | Status: DC | PRN
  Administered 2021-12-13: 50 ug via INTRAVENOUS

## 2021-12-13 MED ORDER — METOCLOPRAMIDE HCL 5 MG/ML IJ SOLN: 5.0000 mg | Freq: Three times a day (TID) | INTRAMUSCULAR | Status: DC | PRN

## 2021-12-13 MED ORDER — ACETAMINOPHEN 325 MG PO TABS: 325.0000 mg | ORAL_TABLET | Freq: Four times a day (QID) | ORAL | Status: DC | PRN

## 2021-12-13 MED ORDER — ONDANSETRON HCL 4 MG/2ML IJ SOLN: 4.0000 mg | Freq: Four times a day (QID) | INTRAMUSCULAR | Status: DC | PRN

## 2021-12-13 MED ORDER — PROMETHAZINE HCL 25 MG/ML IJ SOLN: 6.2500 mg | INTRAMUSCULAR | Status: DC | PRN

## 2021-12-13 MED ORDER — MORPHINE SULFATE (PF) 2 MG/ML IV SOLN: 0.5000 mg | INTRAVENOUS | Status: DC | PRN

## 2021-12-13 MED ORDER — ACETAMINOPHEN 500 MG PO TABS
1000.0000 mg | ORAL_TABLET | Freq: Once | ORAL | Status: AC
  Administered 2021-12-13: 1000 mg via ORAL

## 2021-12-13 MED ORDER — METOCLOPRAMIDE HCL 5 MG PO TABS: 5.0000 mg | ORAL_TABLET | Freq: Three times a day (TID) | ORAL | Status: DC | PRN

## 2021-12-13 MED ORDER — HYDROCODONE-ACETAMINOPHEN 5-325 MG PO TABS: 1.0000 | ORAL_TABLET | ORAL | 0 refills | Status: AC | PRN

## 2021-12-13 MED ORDER — ONDANSETRON HCL 4 MG/2ML IJ SOLN
INTRAMUSCULAR | Status: DC | PRN
  Administered 2021-12-13: 4 mg via INTRAVENOUS

## 2021-12-13 MED ORDER — LACTATED RINGERS IV SOLN: INTRAVENOUS | Status: DC

## 2021-12-13 MED ORDER — DROPERIDOL 2.5 MG/ML IJ SOLN: 0.6250 mg | Freq: Once | INTRAMUSCULAR | Status: DC | PRN

## 2021-12-13 MED ORDER — PROPOFOL 10 MG/ML IV BOLUS
INTRAVENOUS | Status: DC | PRN
  Administered 2021-12-13: 150 mg via INTRAVENOUS

## 2021-12-13 MED ORDER — MIDAZOLAM HCL 5 MG/5ML IJ SOLN
INTRAMUSCULAR | Status: DC | PRN
  Administered 2021-12-13: 2 mg via INTRAVENOUS

## 2021-12-13 SURGICAL SUPPLY — 31 items
APL PRP STRL LF DISP 70% ISPRP (MISCELLANEOUS) ×1
BANDAGE GAUZE 1X75IN STRL (MISCELLANEOUS) ×1 IMPLANT
BNDG CMPR 75X11 PLY HI ABS (MISCELLANEOUS) ×1
BNDG ELASTIC 4X5.8 VLCR STR LF (GAUZE/BANDAGES/DRESSINGS) IMPLANT
BNDG GAUZE 1X75IN STRL (MISCELLANEOUS) ×1
CHLORAPREP W/TINT 26 (MISCELLANEOUS) ×1 IMPLANT
CUFF TOURN SGL QUICK 18X4 (TOURNIQUET CUFF) IMPLANT
DRAPE FLUOR MINI C-ARM 54X84 (DRAPES) ×1 IMPLANT
ELECT REM PT RETURN 9FT ADLT (ELECTROSURGICAL) ×1
ELECTRODE REM PT RTRN 9FT ADLT (ELECTROSURGICAL) ×1 IMPLANT
GAUZE SPONGE 4X4 12PLY STRL (GAUZE/BANDAGES/DRESSINGS) ×1 IMPLANT
GAUZE XEROFORM 1X8 LF (GAUZE/BANDAGES/DRESSINGS) ×1 IMPLANT
GLOVE SURG SYN 9.0  PF PI (GLOVE) ×1
GLOVE SURG SYN 9.0 PF PI (GLOVE) ×1 IMPLANT
GOWN SRG 2XL LVL 4 RGLN SLV (GOWNS) ×1 IMPLANT
GOWN STRL NON-REIN 2XL LVL4 (GOWNS) ×1
GOWN STRL REUS W/ TWL LRG LVL3 (GOWN DISPOSABLE) ×1 IMPLANT
GOWN STRL REUS W/TWL LRG LVL3 (GOWN DISPOSABLE) ×1
K-WIRE DBL END TROCAR 6X.045 (WIRE) ×2
KIT TURNOVER KIT A (KITS) ×1 IMPLANT
KWIRE DBL END TROCAR 6X.045 (WIRE) IMPLANT
MANIFOLD NEPTUNE II (INSTRUMENTS) ×1 IMPLANT
NS IRRIG 500ML POUR BTL (IV SOLUTION) ×1 IMPLANT
PACK EXTREMITY ARMC (MISCELLANEOUS) ×1 IMPLANT
PAD PREP 24X41 OB/GYN DISP (PERSONAL CARE ITEMS) ×1 IMPLANT
PADDING CAST COTTON 2X4 ST (MISCELLANEOUS) ×1 IMPLANT
SCALPEL PROTECTED #15 DISP (BLADE) ×2 IMPLANT
SPONGE GAUZE 2X2 8PLY STRL LF (GAUZE/BANDAGES/DRESSINGS) ×1 IMPLANT
SUT ETHILON 5-0 FS-2 18 BLK (SUTURE) IMPLANT
TRAP FLUID SMOKE EVACUATOR (MISCELLANEOUS) ×1 IMPLANT
WATER STERILE IRR 500ML POUR (IV SOLUTION) ×1 IMPLANT

## 2021-12-13 NOTE — Anesthesia Postprocedure Evaluation (Signed)
Anesthesia Post Note  Patient: Jamie [name]  Procedure(s) Performed: CLOSED REDUCTION METACARPAL WITH PERCUTANEOUS PINNING, LEFT FIFTH (Left: Finger)     Patient location during evaluation: PACU Anesthesia Type: General Level of consciousness: awake and alert Pain management: pain level controlled Vital Signs Assessment: post-procedure vital signs reviewed and stable Respiratory status: spontaneous breathing, nonlabored ventilation and respiratory function stable Cardiovascular status: blood pressure returned to baseline and stable Postop Assessment: no apparent nausea or vomiting Anesthetic complications: no   No notable events documented.  [place] Ouch

## 2021-12-13 NOTE — Op Note (Signed)
12/13/2021  12:43 PM  PATIENT:  Jamie [name]  30 y.o. female  PRE-OPERATIVE DIAGNOSIS:  Closed displaced fracture of shaft of fifth metacarpal bone of left hand, initial encounter  S62.327A  POST-OPERATIVE DIAGNOSIS:  Closed displaced fracture of shaft of fifth metacarpal bone of left   PROCEDURE:  Procedure(s): CLOSED REDUCTION METACARPAL WITH PERCUTANEOUS PINNING, LEFT FIFTH (Left)  SURGEON: Laurene Footman, MD  ASSISTANTS: None  ANESTHESIA:   general  EBL:  No intake/output data recorded.  BLOOD ADMINISTERED:none  DRAINS: none   LOCAL MEDICATIONS USED:  NONE  SPECIMEN:  No Specimen  DISPOSITION OF SPECIMEN:  N/A  COUNTS:  YES  TOURNIQUET:  * Missing tourniquet times found for documented tourniquets in log: [id number] *  IMPLANTS: 0.45 K wire x2  DICTATION: .Dragon Dictation patient was brought to the operating room and after adequate general anesthesia was obtained the left arm was prepped and draped in the usual sterile fashion.  Appropriate patient identification and timeout procedures were completed.  Mini C-arm was brought in and closed reduction performed with by externally rotating the little finger to a corrected position.  2 K wires were then inserted from the fifth metacarpal head into the fourth and there appeared to be anatomic alignment with normal rotation of the finger when bringing the Pont fingers into the palm.  The pins were cut short and bent over followed by Xeroform around the base of the 4 x 4's web roll and a dorsal splint.  PLAN OF CARE: Discharge to home after PACU  PATIENT DISPOSITION:  PACU - hemodynamically stable.

## 2021-12-13 NOTE — Anesthesia Procedure Notes (Addendum)
Procedure Name: LMA Insertion Date/Time: 12/13/2021 12:16 PM  Performed by: Londell Moh, CRNAPre-anesthesia Checklist: Patient identified, Emergency Drugs available, Suction available, Timeout performed and Patient being monitored Patient Re-evaluated:Patient Re-evaluated prior to induction Oxygen Delivery Method: Circle system utilized Preoxygenation: Pre-oxygenation with 100% oxygen Induction Type: IV induction LMA: LMA inserted LMA Size: 4.0 Number of attempts: 1 Placement Confirmation: positive ETCO2 and breath sounds checked- equal and bilateral Tube secured with: Tape

## 2021-12-13 NOTE — Transfer of Care (Signed)
Immediate Anesthesia Transfer of Care Note  Patient: Jamie [name]  Procedure(s) Performed: CLOSED REDUCTION METACARPAL WITH PERCUTANEOUS PINNING, LEFT FIFTH (Left: Finger)  Patient Location: PACU  Anesthesia Type: General  Level of Consciousness: awake, alert  and patient cooperative  Airway and Oxygen Therapy: Patient Spontanous Breathing and Patient connected to supplemental oxygen  Post-op Assessment: Post-op Vital signs reviewed, Patient's Cardiovascular Status Stable, Respiratory Function Stable, Patent Airway and No signs of Nausea or vomiting  Post-op Vital Signs: Reviewed and stable  Complications: No notable events documented.

## 2021-12-13 NOTE — Discharge Instructions (Signed)
Keep arm elevated is much as you can through the weekend Keep splint clean and dry Pain medicine as directed Ice to the back of the hand today and tomorrow may help with pain and swelling Call office if you are having problems

## 2022-09-14 ENCOUNTER — Encounter: Payer: Self-pay | Admitting: *Deleted

## 2022-09-14 ENCOUNTER — Other Ambulatory Visit: Payer: Self-pay

## 2022-09-14 ENCOUNTER — Emergency Department: Payer: Managed Care, Other (non HMO)

## 2022-09-14 ENCOUNTER — Emergency Department
Admission: EM | Admit: 2022-09-14 | Discharge: 2022-09-14 | Disposition: A | Discharge status: Home / Self Care | Payer: Managed Care, Other (non HMO) | Attending: Emergency Medicine | Admitting: Emergency Medicine

## 2022-09-14 DIAGNOSIS — K2900 Acute gastritis without bleeding: Secondary | ICD-10-CM | POA: Diagnosis not present

## 2022-09-14 DIAGNOSIS — R1013 Epigastric pain: Secondary | ICD-10-CM | POA: Diagnosis present

## 2022-09-14 LAB — COMPREHENSIVE METABOLIC PANEL
ALT: 61 U/L — ABNORMAL HIGH (ref 0–44)
AST: 59 U/L — ABNORMAL HIGH (ref 15–41)
Albumin: 4 g/dL (ref 3.5–5.0)
Alkaline Phosphatase: 78 U/L (ref 38–126)
Anion gap: 5 (ref 5–15)
BUN: 9 mg/dL (ref 6–20)
CO2: 23 mmol/L (ref 22–32)
Calcium: 8.6 mg/dL — ABNORMAL LOW (ref 8.9–10.3)
Chloride: 109 mmol/L (ref 98–111)
Creatinine, Ser: 0.77 mg/dL (ref 0.44–1.00)
GFR, Estimated: >60 mL/min (ref >60)
Glucose, Bld: 119 mg/dL — ABNORMAL HIGH (ref 70–99)
Potassium: 4.2 mmol/L (ref 3.5–5.1)
Sodium: 137 mmol/L (ref 135–145)
Total Bilirubin: 0.6 mg/dL (ref 0.3–1.2)
Total Protein: 7 g/dL (ref 6.5–8.1)

## 2022-09-14 LAB — URINALYSIS, ROUTINE W REFLEX MICROSCOPIC
Bacteria, UA: NONE SEEN
Bilirubin Urine: NEGATIVE
Glucose, UA: NEGATIVE mg/dL
Ketones, ur: NEGATIVE mg/dL
Leukocytes,Ua: NEGATIVE
Nitrite: NEGATIVE
Protein, ur: NEGATIVE mg/dL
Specific Gravity, Urine: 1.018 (ref 1.005–1.030)
pH: 5 (ref 5.0–8.0)

## 2022-09-14 LAB — LIPASE, BLOOD: Lipase: 29 U/L (ref 11–51)

## 2022-09-14 LAB — CBC
HCT: 47.2 % — ABNORMAL HIGH (ref 36.0–46.0)
Hemoglobin: 15.8 g/dL — ABNORMAL HIGH (ref 12.0–15.0)
MCH: 29.4 pg (ref 26.0–34.0)
MCHC: 33.5 g/dL (ref 30.0–36.0)
MCV: 87.7 fL (ref 80.0–100.0)
Platelets: 292 10*3/uL (ref 150–400)
RBC: 5.38 MIL/uL — ABNORMAL HIGH (ref 3.87–5.11)
RDW: 12.3 % (ref 11.5–15.5)
WBC: 10.4 10*3/uL (ref 4.0–10.5)
nRBC: 0 % (ref 0.0–0.2)

## 2022-09-14 LAB — POC URINE PREG, ED: Preg Test, Ur: NEGATIVE

## 2022-09-14 MED ORDER — KETOROLAC TROMETHAMINE 10 MG PO TABS
10.0000 mg | ORAL_TABLET | Freq: Once | ORAL | Status: AC
  Administered 2022-09-14: 10 mg via ORAL
  Filled 2022-09-14: qty 1

## 2022-09-14 MED ORDER — HYDROCODONE-ACETAMINOPHEN 5-325 MG PO TABS: 1.0000 | ORAL_TABLET | Freq: Once | ORAL | Status: DC

## 2022-09-14 MED ORDER — PANTOPRAZOLE SODIUM 40 MG PO TBEC: 40.0000 mg | DELAYED_RELEASE_TABLET | Freq: Every day | ORAL | 1 refills | Status: AC

## 2022-09-14 MED ORDER — SUCRALFATE 1 G PO TABS: 1.0000 g | ORAL_TABLET | Freq: Four times a day (QID) | ORAL | 0 refills | Status: AC

## 2022-09-14 MED ORDER — MORPHINE SULFATE (PF) 4 MG/ML IV SOLN
4.0000 mg | Freq: Once | INTRAVENOUS | Status: DC
  Filled 2022-09-14: qty 1

## 2022-09-14 MED ORDER — ALUM & MAG HYDROXIDE-SIMETH 200-200-20 MG/5ML PO SUSP
30.0000 mL | Freq: Once | ORAL | Status: AC
  Administered 2022-09-14: 30 mL via ORAL
  Filled 2022-09-14: qty 30

## 2022-09-14 MED ORDER — LIDOCAINE VISCOUS HCL 2 % MT SOLN
15.0000 mL | Freq: Once | OROMUCOSAL | Status: AC
  Administered 2022-09-14: 15 mL via ORAL
  Filled 2022-09-14: qty 15

## 2022-09-14 NOTE — ED Provider Notes (Signed)
[facility] Provider Note    Event Date/Time   First MD Initiated Contact with Patient 09/14/22 0930     (approximate)  History   Chief Complaint: Abdominal Pain  HPI  Jamie [name] is a 32 y.o. female with past medical history of migraines, brain cyst, presents to the emergency department for upper abdominal pain.  According to the patient yesterday she had some slight lower abdominal discomfort which she states is typical of her ovarian cysts, had mild nausea with an episode of vomiting and an episode of diarrhea yesterday, which again she states is fairly typical when she has ovarian cyst.  However this morning she woke to more significant pain in the epigastric area of the upper abdomen with continued nausea but no further vomiting no lower abdominal discomfort.  No urinary symptoms.  No vaginal bleeding or discharge.  States did have 1 drink yesterday.  Physical Exam   Triage Vital Signs: ED Triage Vitals  Enc Vitals Group     BP 09/14/22 0916 124/86     Pulse Rate 09/14/22 0916 79     Resp 09/14/22 0916 16     Temp 09/14/22 0916 98.5 F (36.9 C)     Temp Source 09/14/22 0916 Oral     SpO2 09/14/22 0916 99 %     Weight 09/14/22 0921 140 lb (63.5 kg)     Height 09/14/22 0921 5' (1.524 m)     Head Circumference --      Peak Flow --      Pain Score 09/14/22 0920 6     Pain Loc --      Pain Edu? --      Excl. in GC? --     Most recent vital signs: Vitals:   09/14/22 0916  BP: 124/86  Pulse: 79  Resp: 16  Temp: 98.5 F (36.9 C)  SpO2: 99%    General: Awake, no distress.  CV:  Good peripheral perfusion.  Regular rate and rhythm  Resp:  Normal effort.  Equal breath sounds bilaterally.  Abd:  No distention.  Soft, mild epigastric tenderness to palpation otherwise benign abdomen without rebound or guarding  ED Results / Procedures / Treatments   RADIOLOGY  Normal right upper quadrant ultrasound   MEDICATIONS ORDERED IN  ED: Medications  alum & mag hydroxide-simeth (MAALOX/MYLANTA) 200-200-20 MG/5ML suspension 30 mL (has no administration in time range)    And  lidocaine (XYLOCAINE) 2 % viscous mouth solution 15 mL (has no administration in time range)     IMPRESSION / MDM / ASSESSMENT AND PLAN / ED COURSE  I reviewed the triage vital signs and the nursing notes.  Patient's presentation is most consistent with acute presentation with potential threat to life or bodily function.  Patient presents to the emergency department for epigastric pain starting last night continuing today.  Patient had episode of vomiting and diarrhea yesterday as well.  States yesterday she thought she had some mild lower abdominal pain although states it has since resolved denies any lower abdominal pain today no urinary symptoms.  No vaginal symptoms.  On exam patient does have mild tenderness to the epigastrium no rebound or guarding.  Tenderness does extend slightly to the right upper quadrant.  Patient's lab work today so far shows a normal CBC with a normal white blood cell count.  Patient's chemistry does show mild LFT elevation with a normal total bilirubin and a normal lipase.  Pregnancy test is negative.  Given the patient's epigastric tenderness with elevated LFTs will obtain a right upper quadrant ultrasound to further evaluate.  Will also dose of GI cocktail.  Differential would include cholecystitis choledocholithiasis, biliary colic, gastritis, esophagitis, gastroenteritis.  Patient's ultrasound shows no concerning findings.  Given the patient's mild LFT elevation I did discuss this further with the patient he does admit more frequent alcohol use recently.  Discussed with the patient trying to avoid alcohol use or at least limited in the future.  Will start the patient on Protonix and sucralfate have her follow-up with GI medicine.  Suspect more of a gastritis picture.  FINAL CLINICAL IMPRESSION(S) / ED DIAGNOSES    Gastritis  Rx / DC Orders   Sucralfate Protonix  Note:  This document was prepared using Dragon voice recognition software and may include unintentional dictation errors.   Minna Antis, MD 09/14/22 1213

## 2022-09-14 NOTE — Discharge Instructions (Addendum)
Please follow-up with GI medicine by calling the number provided if you continue to have any upper abdominal discomfort.  As we discussed for the next 2 weeks please take sucralfate tablet before breakfast, lunch, dinner and before going to bed, for the next 2 months please take Protonix each morning.  Return to the emergency department for any return of/worsening pain or any other symptom concerning to yourself.

## 2022-09-14 NOTE — ED Triage Notes (Signed)
Per patient's report, patient c/o lower abdominal pain yesterday and is now mid-abdominal pain. Patient c/o nausea and vomiting x1 and diarrhea for two days. Patient states she has a history of ovarian cysts which has these symptoms.

## 2023-10-12 ENCOUNTER — Ambulatory Visit
Admission: EM | Admit: 2023-10-12 | Discharge: 2023-10-12 | Disposition: A | Discharge status: Home / Self Care | Attending: Physician Assistant | Admitting: Physician Assistant

## 2023-10-12 DIAGNOSIS — M546 Pain in thoracic spine: Secondary | ICD-10-CM | POA: Insufficient documentation

## 2023-10-12 LAB — POCT URINALYSIS DIP (MANUAL ENTRY)
Bilirubin, UA: NEGATIVE
Glucose, UA: NEGATIVE mg/dL
Ketones, POC UA: NEGATIVE mg/dL
Leukocytes, UA: NEGATIVE
Nitrite, UA: NEGATIVE
Protein Ur, POC: NEGATIVE mg/dL
Spec Grav, UA: <=1.005 — AB (ref 1.010–1.025)
Urobilinogen, UA: 0.2 U/dL
pH, UA: 5.5 (ref 5.0–8.0)

## 2023-10-12 LAB — POCT URINE PREGNANCY: Preg Test, Ur: NEGATIVE

## 2023-10-12 NOTE — ED Triage Notes (Signed)
 Patient to Urgent Care with complaints of left sided flank pain, sharp and aching pain. Symptoms started Saturday- reports that she pulled a heavy wagon. Pain worse today and lower. Reports she has been pushing urine.   Hx of sciatica (6/2)- reports this feels different.

## 2023-10-12 NOTE — Discharge Instructions (Addendum)
 Take ibuprofen  and a muscle relaxer as needed.  Return if symptoms worsen or change.  Your urine culture is pending.  Go to the emergency department for evaluation of possible kidney stone if pain worsens or changes.

## 2023-10-12 NOTE — ED Provider Notes (Signed)
 Jamie [name]    CSN: [id number] Arrival date & time: 10/12/23  1924      History   Chief Complaint Chief Complaint  Patient presents with   Flank Pain    HPI Jamie [name] is a 33 y.o. female.   Patient complains of right-sided flank pain.  Patient reports the symptoms began today.  Patient is concerned that she has a kidney infection.  Patient [place].  Patient has some increased pain with moving.  Patient denies any fever or chills she has not had any nausea or vomiting patient does not have any abdominal pain.  Patient denies chest pain or shortness of breath.  The history is provided by the patient. No language interpreter was used.  Flank Pain    Past Medical History:  Diagnosis Date   Eczema    Migraine    none in last 6 months   Ovarian cyst    Ovarian cyst 01/22/2014   left side   Wears contact lenses     Patient Active Problem List   Diagnosis Date Noted   Labor and delivery, indication for care 07/16/2017   Postoperative state 07/16/2017   Vaginal bleeding in pregnancy, third trimester 04/27/2017   Supervision of high risk pregnancy in third trimester 12/15/2016   Uterine adnexal pain 05/02/2016   S/P left oophorectomy 06/10/2015   Rh negative state in antepartum period 12/19/2013    Past Surgical History:  Procedure Laterality Date   CESAREAN SECTION N/A 07/16/2017   Procedure: CESAREAN SECTION;  Surgeon: Schermerhorn, Debby PARAS, MD;  Location: [facility];  Service: Obstetrics;  Laterality: N/A;  Girl born @ 77 Apgars: 9/9 Weight: 7lbs 7 oz   CLOSED REDUCTION METACARPAL WITH PERCUTANEOUS PINNING Left 12/13/2021   Procedure: CLOSED REDUCTION METACARPAL WITH PERCUTANEOUS PINNING, LEFT FIFTH;  Surgeon: Kathlynn Sharper, MD;  Location: [facility];  Service: Orthopedics;  Laterality: Left;   DILATION AND CURETTAGE OF UTERUS  01/2014   LAPAROSCOPIC OVARIAN CYSTECTOMY Left  01/29/2015   Procedure: LAPAROSCOPIC OVARIAN CYSTECTOMY;  Surgeon: Archie Savers, MD;  Location: [facility];  Service: Gynecology;  Laterality: Left;   WISDOM TOOTH EXTRACTION Bilateral 2009   all 4 teeth    OB History     Gravida  3   Para  1   Term  1   Preterm      AB  2   Living  1      SAB  2   IAB  0   Ectopic      Multiple  0   Live Births  1            Home Medications    Prior to Admission medications   Medication Sig Start Date End Date Taking? Authorizing Provider  busPIRone (BUSPAR) 5 MG tablet Take 5 mg by mouth. 01/30/23  Yes [provider]  metFORMIN (GLUCOPHAGE) 500 MG tablet Take 500 mg by mouth. 05/05/23 05/04/24 Yes [provider]  butalbital-acetaminophen -caffeine (FIORICET) 50-325-40 MG tablet Take by mouth 2 (two) times daily as needed for headache.    [provider]  clobetasol cream (TEMOVATE) 0.05 % Apply 1 Application topically 2 (two) times daily as needed.    [provider]  cyanocobalamin ([employer] VITAMIN B12) 1000 MCG tablet Take 1,000 mcg by mouth 2 (two) times a week. Patient not taking: Reported on 10/12/2023    [provider]  levonorgestrel (MIRENA) 20  MCG/DAY IUD 1 each by Intrauterine route once.    [provider]  metaxalone (SKELAXIN) 800 MG tablet Take 800 mg by mouth. 10/02/23 10/12/23  [provider]  pantoprazole  (PROTONIX ) 40 MG tablet Take 1 tablet (40 mg total) by mouth daily. 09/14/22 09/14/23  Dorothyann Drivers, MD  sucralfate  (CARAFATE ) 1 g tablet Take 1 tablet (1 g total) by mouth 4 (four) times daily for 15 days. Patient not taking: Reported on 10/12/2023 09/14/22 09/29/22  Dorothyann Drivers, MD  traMADol (ULTRAM) 50 MG tablet Take by mouth every 6 (six) hours as needed.    [provider]    Family History Family History  Problem Relation Age of Onset   Hypertension Father     Social History Social History   Tobacco Use   Smoking status:  Never   Smokeless tobacco: Never  Vaping Use   Vaping status: Never Used  Substance Use Topics   Alcohol use: Yes    Alcohol/week: 0.0 - 1.0 standard drinks of alcohol    Comment: occas, 1 drink every 2-3 weeks   Drug use: No     Allergies   Amoxicillin, Penicillins, and Adhesive [tape]   Review of Systems Review of Systems  Genitourinary:  Positive for flank pain.  All other systems reviewed and are negative.    Physical Exam Triage Vital Signs ED Triage Vitals  Encounter Vitals Group     BP 10/12/23 1942 107/77     Girls Systolic BP Percentile --      Girls Diastolic BP Percentile --      Boys Systolic BP Percentile --      Boys Diastolic BP Percentile --      Pulse Rate 10/12/23 1942 84     Resp 10/12/23 1942 18     Temp 10/12/23 1942 98.3 F (36.8 C)     Temp src --      SpO2 10/12/23 1942 96 %     Weight --      Height --      Head Circumference --      Peak Flow --      Pain Score 10/12/23 1932 5     Pain Loc --      Pain Education --      Exclude from Growth Chart --    No data found.  Updated Vital Signs BP 107/77   Pulse 84   Temp 98.3 F (36.8 C)   Resp 18   SpO2 96%   Visual Acuity Right Eye Distance:   Left Eye Distance:   Bilateral Distance:    Right Eye Near:   Left Eye Near:    Bilateral Near:     Physical Exam Vitals reviewed.  Constitutional:      Appearance: Normal appearance.  Pulmonary:     Effort: Pulmonary effort is normal.  Abdominal:     General: Abdomen is flat.     Palpations: Abdomen is soft.     Tenderness: There is no abdominal tenderness.  Musculoskeletal:        General: Normal range of motion.     Comments: Tender left flank and left lower thoracic upper lumbar paraspinal area,  Skin:    General: Skin is warm.  Neurological:     General: No focal deficit present.     Mental Status: She is alert.    Patient has been drinking a large amount of water.  Urine is very clear.  Will send a culture as urine  is very dilute.  Patient is advised ibuprofen  for discomfort.  She has Robaxin at home that she can take.  Patient's symptoms seem to be more musculoskeletal.  The area of p pain is in the flank area, patient has not noticed any blood.  She has not had any history of kidney stones in the past.  Patient is advised to return for further evaluation if symptoms worsen.  Patient is discharged in stable condition  UC Treatments / Results  Labs (all labs ordered are listed, but only abnormal results are displayed) Labs Reviewed  POCT URINALYSIS DIP (MANUAL ENTRY) - Abnormal; Notable for the following components:      Result Value   Color, UA colorless (*)    Spec Grav, UA <=1.005 (*)    Blood, UA trace-lysed (*)    All other components within normal limits  POCT URINE PREGNANCY    EKG   Radiology No results found.  Procedures Procedures (including critical care time)  Medications Ordered in UC Medications - No data to display  Initial Impression / Assessment and Plan / UC Course  I have reviewed the triage vital signs and the nursing notes.  Pertinent labs & imaging results that were available during my care of the patient were reviewed by me and considered in my medical decision making (see chart for details).      Final Clinical Impressions(s) / UC Diagnoses   Final diagnoses:  Acute thoracic back pain, unspecified back pain laterality   Discharge Instructions   None    ED Prescriptions   None    PDMP not reviewed this encounter. An After Visit Summary was printed and given to the patient.       Flint Sonny POUR, PA-C 10/12/23 1958

## 2023-10-14 ENCOUNTER — Ambulatory Visit (HOSPITAL_COMMUNITY): Payer: Self-pay

## 2023-10-14 LAB — URINE CULTURE
Culture: <10000 — AB
Special Requests: NORMAL
